# Patient Record
Sex: Female | Born: 1980 | Race: Black or African American | Hispanic: No | Marital: Single | Smoking: Current every day smoker
Health system: Southern US, Community
[De-identification: ages and names within clinical notes are randomized; demographics above are authoritative.]

## PROBLEM LIST (undated history)

## (undated) DIAGNOSIS — F329 Major depressive disorder, single episode, unspecified: Secondary | ICD-10-CM

## (undated) DIAGNOSIS — R519 Headache, unspecified: Secondary | ICD-10-CM

## (undated) DIAGNOSIS — D649 Anemia, unspecified: Secondary | ICD-10-CM

## (undated) DIAGNOSIS — F419 Anxiety disorder, unspecified: Secondary | ICD-10-CM

## (undated) DIAGNOSIS — F319 Bipolar disorder, unspecified: Secondary | ICD-10-CM

## (undated) DIAGNOSIS — O24419 Gestational diabetes mellitus in pregnancy, unspecified control: Secondary | ICD-10-CM

## (undated) DIAGNOSIS — O139 Gestational [pregnancy-induced] hypertension without significant proteinuria, unspecified trimester: Secondary | ICD-10-CM

## (undated) DIAGNOSIS — F32A Depression, unspecified: Secondary | ICD-10-CM

## (undated) HISTORY — DX: Anemia, unspecified: D64.9

## (undated) HISTORY — DX: Gestational (pregnancy-induced) hypertension without significant proteinuria, unspecified trimester: O13.9

## (undated) HISTORY — DX: Headache, unspecified: R51.9

## (undated) HISTORY — DX: Anxiety disorder, unspecified: F41.9

## (undated) HISTORY — DX: Depression, unspecified: F32.A

---
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
## 2001-05-20 ENCOUNTER — Emergency Department (HOSPITAL_COMMUNITY): Admission: EM | Admit: 2001-05-20 | Discharge: 2001-05-20 | Payer: Self-pay | Admitting: *Deleted

## 2008-12-22 ENCOUNTER — Emergency Department (HOSPITAL_COMMUNITY): Admission: EM | Admit: 2008-12-22 | Discharge: 2008-12-22 | Payer: Self-pay | Admitting: Emergency Medicine

## 2008-12-28 ENCOUNTER — Emergency Department (HOSPITAL_COMMUNITY): Admission: EM | Admit: 2008-12-28 | Discharge: 2008-12-28 | Payer: Self-pay | Admitting: Emergency Medicine

## 2009-02-03 ENCOUNTER — Inpatient Hospital Stay (HOSPITAL_COMMUNITY): Admission: AD | Admit: 2009-02-03 | Discharge: 2009-02-03 | Payer: Self-pay | Admitting: Obstetrics & Gynecology

## 2009-03-19 ENCOUNTER — Ambulatory Visit (HOSPITAL_COMMUNITY): Admission: RE | Admit: 2009-03-19 | Discharge: 2009-03-19 | Payer: Self-pay | Admitting: *Deleted

## 2009-04-18 ENCOUNTER — Ambulatory Visit (HOSPITAL_COMMUNITY): Admission: RE | Admit: 2009-04-18 | Discharge: 2009-04-18 | Payer: Self-pay | Admitting: Obstetrics & Gynecology

## 2009-09-07 ENCOUNTER — Ambulatory Visit: Payer: Self-pay | Admitting: Obstetrics & Gynecology

## 2009-09-07 ENCOUNTER — Inpatient Hospital Stay (HOSPITAL_COMMUNITY): Admission: AD | Admit: 2009-09-07 | Discharge: 2009-09-14 | Payer: Self-pay | Admitting: Family Medicine

## 2009-09-10 ENCOUNTER — Encounter: Payer: Self-pay | Admitting: Obstetrics & Gynecology

## 2009-09-21 ENCOUNTER — Observation Stay (HOSPITAL_COMMUNITY): Admission: AD | Admit: 2009-09-21 | Discharge: 2009-09-23 | Payer: Self-pay | Admitting: Obstetrics & Gynecology

## 2009-09-26 ENCOUNTER — Ambulatory Visit: Payer: Self-pay | Admitting: Obstetrics and Gynecology

## 2009-10-03 ENCOUNTER — Ambulatory Visit: Payer: Self-pay | Admitting: Obstetrics and Gynecology

## 2009-10-10 ENCOUNTER — Ambulatory Visit: Payer: Self-pay | Admitting: Obstetrics and Gynecology

## 2009-10-17 ENCOUNTER — Ambulatory Visit: Payer: Self-pay | Admitting: Obstetrics and Gynecology

## 2009-10-26 ENCOUNTER — Ambulatory Visit: Payer: Self-pay | Admitting: Obstetrics & Gynecology

## 2009-11-09 ENCOUNTER — Ambulatory Visit: Payer: Self-pay | Admitting: Obstetrics & Gynecology

## 2010-06-02 LAB — CBC
Hemoglobin: 6.5 g/dL — CL (ref 12.0–15.0)
MCH: 32.1 pg (ref 26.0–34.0)
MCV: 97 fL (ref 78.0–100.0)
RBC: 2.03 MIL/uL — ABNORMAL LOW (ref 3.87–5.11)
RDW: 13.6 % (ref 11.5–15.5)
WBC: 18.4 10*3/uL — ABNORMAL HIGH (ref 4.0–10.5)

## 2010-06-02 LAB — CULTURE, ROUTINE-ABSCESS

## 2010-06-02 LAB — URINALYSIS, ROUTINE W REFLEX MICROSCOPIC
Bilirubin Urine: NEGATIVE
Ketones, ur: NEGATIVE mg/dL
Protein, ur: NEGATIVE mg/dL
Specific Gravity, Urine: 1.01 (ref 1.005–1.030)
Urobilinogen, UA: 0.2 mg/dL (ref 0.0–1.0)

## 2010-06-02 LAB — COMPREHENSIVE METABOLIC PANEL
Albumin: 2.2 g/dL — ABNORMAL LOW (ref 3.5–5.2)
Alkaline Phosphatase: 202 U/L — ABNORMAL HIGH (ref 39–117)
CO2: 23 mEq/L (ref 19–32)
Chloride: 107 mEq/L (ref 96–112)
GFR calc Af Amer: >60 mL/min (ref >60)
Glucose, Bld: 96 mg/dL (ref 70–99)
Potassium: 4.2 mEq/L (ref 3.5–5.1)
Sodium: 137 mEq/L (ref 135–145)
Total Bilirubin: 0.4 mg/dL (ref 0.3–1.2)

## 2010-06-02 LAB — PROTEIN / CREATININE RATIO, URINE

## 2010-06-03 LAB — COMPREHENSIVE METABOLIC PANEL
ALT: 11 U/L (ref 0–35)
ALT: 13 U/L (ref 0–35)
AST: 24 U/L (ref 0–37)
AST: 32 U/L (ref 0–37)
Albumin: 1.7 g/dL — ABNORMAL LOW (ref 3.5–5.2)
BUN: 10 mg/dL (ref 6–23)
BUN: 5 mg/dL — ABNORMAL LOW (ref 6–23)
BUN: 8 mg/dL (ref 6–23)
CO2: 17 mEq/L — ABNORMAL LOW (ref 19–32)
CO2: 17 mEq/L — ABNORMAL LOW (ref 19–32)
CO2: 23 mEq/L (ref 19–32)
Calcium: 7.5 mg/dL — ABNORMAL LOW (ref 8.4–10.5)
Chloride: 105 mEq/L (ref 96–112)
Creatinine, Ser: 1.45 mg/dL — ABNORMAL HIGH (ref 0.4–1.2)
Creatinine, Ser: 1.55 mg/dL — ABNORMAL HIGH (ref 0.4–1.2)
Creatinine, Ser: 1.69 mg/dL — ABNORMAL HIGH (ref 0.4–1.2)
GFR calc Af Amer: 44 mL/min — ABNORMAL LOW (ref >60)
GFR calc non Af Amer: 36 mL/min — ABNORMAL LOW (ref >60)
GFR calc non Af Amer: 43 mL/min — ABNORMAL LOW (ref >60)
GFR calc non Af Amer: >60 mL/min (ref >60)
Glucose, Bld: 113 mg/dL — ABNORMAL HIGH (ref 70–99)
Glucose, Bld: 113 mg/dL — ABNORMAL HIGH (ref 70–99)
Glucose, Bld: 92 mg/dL (ref 70–99)
Potassium: 3.5 mEq/L (ref 3.5–5.1)
Sodium: 130 mEq/L — ABNORMAL LOW (ref 135–145)
Sodium: 136 mEq/L (ref 135–145)
Total Bilirubin: 0.5 mg/dL (ref 0.3–1.2)
Total Bilirubin: 0.5 mg/dL (ref 0.3–1.2)
Total Protein: 3.9 g/dL — ABNORMAL LOW (ref 6.0–8.3)
Total Protein: 5.7 g/dL — ABNORMAL LOW (ref 6.0–8.3)

## 2010-06-03 LAB — BASIC METABOLIC PANEL
CO2: 27 mEq/L (ref 19–32)
Calcium: 7 mg/dL — ABNORMAL LOW (ref 8.4–10.5)
Calcium: 7.7 mg/dL — ABNORMAL LOW (ref 8.4–10.5)
Creatinine, Ser: 1.25 mg/dL — ABNORMAL HIGH (ref 0.4–1.2)
GFR calc Af Amer: 50 mL/min — ABNORMAL LOW (ref >60)
GFR calc Af Amer: >60 mL/min (ref >60)
GFR calc non Af Amer: 41 mL/min — ABNORMAL LOW (ref >60)
Potassium: 3.7 mEq/L (ref 3.5–5.1)
Sodium: 136 mEq/L (ref 135–145)

## 2010-06-03 LAB — CBC
HCT: 19.9 % — ABNORMAL LOW (ref 36.0–46.0)
HCT: 24.3 % — ABNORMAL LOW (ref 36.0–46.0)
Hemoglobin: 10.6 g/dL — ABNORMAL LOW (ref 12.0–15.0)
Hemoglobin: 7 g/dL — ABNORMAL LOW (ref 12.0–15.0)
Hemoglobin: 7.4 g/dL — ABNORMAL LOW (ref 12.0–15.0)
Hemoglobin: 8.8 g/dL — ABNORMAL LOW (ref 12.0–15.0)
MCH: 33.4 pg (ref 26.0–34.0)
MCH: 33.5 pg (ref 26.0–34.0)
MCH: 33.5 pg (ref 26.0–34.0)
MCH: 33.7 pg (ref 26.0–34.0)
MCH: 34.7 pg — ABNORMAL HIGH (ref 26.0–34.0)
MCHC: 34.4 g/dL (ref 30.0–36.0)
MCHC: 34.5 g/dL (ref 30.0–36.0)
MCHC: 34.7 g/dL (ref 30.0–36.0)
MCV: 96.2 fL (ref 78.0–100.0)
MCV: 96.2 fL (ref 78.0–100.0)
MCV: 96.4 fL (ref 78.0–100.0)
MCV: 96.9 fL (ref 78.0–100.0)
MCV: 97.1 fL (ref 78.0–100.0)
MCV: 97.1 fL (ref 78.0–100.0)
Platelets: 100 10*3/uL — ABNORMAL LOW (ref 150–400)
Platelets: 101 10*3/uL — ABNORMAL LOW (ref 150–400)
Platelets: 102 10*3/uL — ABNORMAL LOW (ref 150–400)
Platelets: 71 10*3/uL — ABNORMAL LOW (ref 150–400)
Platelets: 80 10*3/uL — ABNORMAL LOW (ref 150–400)
Platelets: 82 10*3/uL — ABNORMAL LOW (ref 150–400)
Platelets: 87 10*3/uL — ABNORMAL LOW (ref 150–400)
RBC: 2.07 MIL/uL — ABNORMAL LOW (ref 3.87–5.11)
RBC: 2.14 MIL/uL — ABNORMAL LOW (ref 3.87–5.11)
RBC: 3.14 MIL/uL — ABNORMAL LOW (ref 3.87–5.11)
RBC: 3.26 MIL/uL — ABNORMAL LOW (ref 3.87–5.11)
RBC: 3.3 MIL/uL — ABNORMAL LOW (ref 3.87–5.11)
RDW: 13.1 % (ref 11.5–15.5)
RDW: 13.1 % (ref 11.5–15.5)
RDW: 13.1 % (ref 11.5–15.5)
RDW: 13.5 % (ref 11.5–15.5)
RDW: 13.6 % (ref 11.5–15.5)
RDW: 13.6 % (ref 11.5–15.5)
WBC: 16.5 10*3/uL — ABNORMAL HIGH (ref 4.0–10.5)
WBC: 16.5 10*3/uL — ABNORMAL HIGH (ref 4.0–10.5)
WBC: 17.8 10*3/uL — ABNORMAL HIGH (ref 4.0–10.5)
WBC: 8.4 10*3/uL (ref 4.0–10.5)
WBC: 9.1 10*3/uL (ref 4.0–10.5)
WBC: 9.7 10*3/uL (ref 4.0–10.5)

## 2010-06-03 LAB — MAGNESIUM
Magnesium: 12 mg/dL (ref 1.5–2.5)
Magnesium: 6.7 mg/dL (ref 1.5–2.5)

## 2010-06-03 LAB — CROSSMATCH

## 2010-06-03 LAB — URINALYSIS, DIPSTICK ONLY
Bilirubin Urine: NEGATIVE
Nitrite: NEGATIVE
Protein, ur: NEGATIVE mg/dL
Specific Gravity, Urine: <1.005 — ABNORMAL LOW (ref 1.005–1.030)

## 2010-06-03 LAB — MRSA PCR SCREENING: MRSA by PCR: NEGATIVE

## 2010-06-03 LAB — APTT: aPTT: 31 seconds (ref 24–37)

## 2010-06-03 LAB — FIBRINOGEN: Fibrinogen: 437 mg/dL (ref 204–475)

## 2010-06-03 LAB — PROTIME-INR
INR: 1.19 (ref 0.00–1.49)
INR: 1.23 (ref 0.00–1.49)
Prothrombin Time: 15 seconds (ref 11.6–15.2)
Prothrombin Time: 15.4 seconds — ABNORMAL HIGH (ref 11.6–15.2)

## 2010-06-03 LAB — RPR: RPR Ser Ql: NONREACTIVE

## 2010-12-10 ENCOUNTER — Emergency Department (HOSPITAL_COMMUNITY)
Admission: EM | Admit: 2010-12-10 | Discharge: 2010-12-11 | Disposition: A | Discharge status: Home / Self Care | Payer: Medicaid Other | Attending: Emergency Medicine | Admitting: Emergency Medicine

## 2010-12-10 DIAGNOSIS — R0989 Other specified symptoms and signs involving the circulatory and respiratory systems: Secondary | ICD-10-CM | POA: Insufficient documentation

## 2010-12-10 DIAGNOSIS — R5381 Other malaise: Secondary | ICD-10-CM | POA: Insufficient documentation

## 2010-12-10 DIAGNOSIS — J069 Acute upper respiratory infection, unspecified: Secondary | ICD-10-CM | POA: Insufficient documentation

## 2010-12-10 DIAGNOSIS — R05 Cough: Secondary | ICD-10-CM | POA: Insufficient documentation

## 2010-12-10 DIAGNOSIS — R0609 Other forms of dyspnea: Secondary | ICD-10-CM | POA: Insufficient documentation

## 2010-12-10 DIAGNOSIS — R071 Chest pain on breathing: Secondary | ICD-10-CM | POA: Insufficient documentation

## 2010-12-10 DIAGNOSIS — R059 Cough, unspecified: Secondary | ICD-10-CM | POA: Insufficient documentation

## 2010-12-11 ENCOUNTER — Emergency Department (HOSPITAL_COMMUNITY): Payer: Medicaid Other

## 2011-06-29 LAB — OB RESULTS CONSOLE RUBELLA ANTIBODY, IGM: Rubella: IMMUNE

## 2011-06-29 LAB — OB RESULTS CONSOLE GC/CHLAMYDIA: Chlamydia: NEGATIVE

## 2011-06-29 LAB — OB RESULTS CONSOLE HEPATITIS B SURFACE ANTIGEN: Hepatitis B Surface Ag: NEGATIVE

## 2011-06-29 LAB — OB RESULTS CONSOLE ABO/RH: RH Type: POSITIVE

## 2011-07-06 ENCOUNTER — Emergency Department (HOSPITAL_COMMUNITY)
Admission: EM | Admit: 2011-07-06 | Discharge: 2011-07-06 | Disposition: A | Discharge status: Home / Self Care | Payer: Medicaid Other | Attending: Emergency Medicine | Admitting: Emergency Medicine

## 2011-07-06 ENCOUNTER — Encounter (HOSPITAL_COMMUNITY): Payer: Self-pay | Admitting: Emergency Medicine

## 2011-07-06 DIAGNOSIS — F319 Bipolar disorder, unspecified: Secondary | ICD-10-CM | POA: Insufficient documentation

## 2011-07-06 DIAGNOSIS — F17200 Nicotine dependence, unspecified, uncomplicated: Secondary | ICD-10-CM | POA: Insufficient documentation

## 2011-07-06 DIAGNOSIS — K089 Disorder of teeth and supporting structures, unspecified: Secondary | ICD-10-CM | POA: Insufficient documentation

## 2011-07-06 DIAGNOSIS — K0889 Other specified disorders of teeth and supporting structures: Secondary | ICD-10-CM

## 2011-07-06 HISTORY — DX: Bipolar disorder, unspecified: F31.9

## 2011-07-06 MED ORDER — AMOXICILLIN 500 MG PO CAPS: 1000.0000 mg | ORAL_CAPSULE | Freq: Two times a day (BID) | ORAL | Status: AC

## 2011-07-06 MED ORDER — AMOXICILLIN 500 MG PO CAPS
1000.0000 mg | ORAL_CAPSULE | Freq: Once | ORAL | Status: AC
  Administered 2011-07-06: 1000 mg via ORAL
  Filled 2011-07-06: qty 2

## 2011-07-06 MED ORDER — OXYCODONE-ACETAMINOPHEN 5-325 MG PO TABS
1.0000 | ORAL_TABLET | Freq: Once | ORAL | Status: AC
  Administered 2011-07-06: 1 via ORAL
  Filled 2011-07-06: qty 1

## 2011-07-06 MED ORDER — OXYCODONE-ACETAMINOPHEN 5-325 MG PO TABS
1.0000 | ORAL_TABLET | ORAL | Status: AC | PRN
Start: 2011-07-06 — End: 2011-07-16

## 2011-07-06 NOTE — ED Provider Notes (Signed)
History     CSN: [id number]  Arrival date & time 07/06/11  0701   First MD Initiated Contact with Patient 07/06/11 [id number]      Chief Complaint  Patient presents with  . Dental Pain    (Consider location/radiation/quality/duration/timing/severity/associated sxs/prior treatment) Patient is a 31 y.o. female presenting with tooth pain. The history is provided by the patient.  Dental Pain She having pain in lower tooth on the right side for the last 2 weeks. This has been getting worse. She's been taking large doses of acetaminophen with no relief. Is worse with eating. Nothing makes it any better. She has had a prior extraction and that seems to be the focus for where her pain is. Of note, she is currently pregnant.  Past Medical History  Diagnosis Date  . Bipolar 1 disorder     Past Surgical History  Procedure Date  . Cesarean section     History reviewed. No pertinent family history.  History  Substance Use Topics  . Smoking status: Current Everyday Smoker -- 0.5 packs/day  . Smokeless tobacco: Not on file  . Alcohol Use: No    OB History    Grav Para Term Preterm Abortions TAB SAB Ect Mult Living   1               Review of Systems  All other systems reviewed and are negative.    Allergies  Review of patient's allergies indicates no known allergies.  Home Medications   Current Outpatient Rx  Name Route Sig Dispense Refill  . ACETAMINOPHEN 500 MG PO TABS Oral Take 1,000 mg by mouth every 6 (six) hours as needed. For pain    . ARIPIPRAZOLE 10 MG PO TABS Oral Take 10 mg by mouth daily.    Marland Kitchen PRENATAL MULTIVITAMIN CH Oral Take 1 tablet by mouth daily.    . TRAZODONE HCL 50 MG PO TABS Oral Take 50 mg by mouth at bedtime.    . AMOXICILLIN 500 MG PO CAPS Oral Take 2 capsules (1,000 mg total) by mouth 2 (two) times daily. 40 capsule 0  . OXYCODONE-ACETAMINOPHEN 5-325 MG PO TABS Oral Take 1 tablet by mouth every 4 (four) hours as needed for pain. 20 tablet 0    BP  109/92  Pulse 94  Temp(Src) 98.9 F (37.2 C) (Oral)  Resp 18  SpO2 100%  Physical Exam  Nursing note and vitals reviewed.  35-year-old female who appears uncomfortable. Vital signs are normal. Oxygen saturation is 100% which is normal. Head is normal cephalic and atraumatic. PERRLA, EOMI. There has been prior extraction of tooth #28 but there is no tenderness to palpation of the gingiva at that location. There is also some erythema and swelling of the gingiva around teeth #32 but this that area does not have any tenderness. Fillings are noted but no active dental caries are noted.  ED Course  Procedures (including critical care time)  Labs Reviewed - No data to display No results found.   1. Pain, dental       MDM  Lower gum pain lobe possibly related to retained fragment of tooth that had previously been extracted. She will be placed on amoxicillin and given Percocet for pain and is referred to the on-call dentist for followup care.        Dione Booze, MD 07/06/11 [phone]

## 2011-07-06 NOTE — Discharge Instructions (Signed)
Called the dentist tomorrow  Dental Pain A tooth ache may be caused by cavities (tooth decay). Cavities expose the nerve of the tooth to air and hot or cold temperatures. It may come from an infection or abscess (also called a boil or furuncle) around your tooth. It is also often caused by dental caries (tooth decay). This causes the pain you are having. DIAGNOSIS  Your caregiver can diagnose this problem by exam. TREATMENT   If caused by an infection, it may be treated with medications which kill germs (antibiotics) and pain medications as prescribed by your caregiver. Take medications as directed.   Only take over-the-counter or prescription medicines for pain, discomfort, or fever as directed by your caregiver.   Whether the tooth ache today is caused by infection or dental disease, you should see your dentist as soon as possible for further care.  SEEK MEDICAL CARE IF: The exam and treatment you received today has been provided on an emergency basis only. This is not a substitute for complete medical or dental care. If your problem worsens or new problems (symptoms) appear, and you are unable to meet with your dentist, call or return to this location. SEEK IMMEDIATE MEDICAL CARE IF:   You have a fever.   You develop redness and swelling of your face, jaw, or neck.   You are unable to open your mouth.   You have severe pain uncontrolled by pain medicine.  MAKE SURE YOU:   Understand these instructions.   Will watch your condition.   Will get help right away if you are not doing well or get worse.  Document Released: 03/03/2005 Document Revised: 02/20/2011 Document Reviewed: 10/20/2007 [facility] Patient Information 2012 [place], [place].  Acetaminophen; Oxycodone tablets What is this medicine? ACETAMINOPHEN; OXYCODONE (a set a MEE noe fen; ox i KOE done) is a pain reliever. It is used to treat mild to moderate pain. This medicine may be used for other purposes; ask your health  care provider or pharmacist if you have questions. What should I tell my health care provider before I take this medicine? They need to know if you have any of these conditions: -brain tumor -Crohn's disease, inflammatory bowel disease, or ulcerative colitis -drink more than 3 alcohol containing drinks per day -drug abuse or addiction -head injury -heart or circulation problems -kidney disease or problems going to the bathroom -liver disease -lung disease, asthma, or breathing problems -an unusual or allergic reaction to acetaminophen, oxycodone, other opioid analgesics, other medicines, foods, dyes, or preservatives -pregnant or trying to get pregnant -breast-feeding How should I use this medicine? Take this medicine by mouth with a full glass of water. Follow the directions on the prescription label. Take your medicine at regular intervals. Do not take your medicine more often than directed. Talk to your pediatrician regarding the use of this medicine in children. Special care may be needed. Patients over 74 years old may have a stronger reaction and need a smaller dose. Overdosage: If you think you have taken too much of this medicine contact a poison control center or emergency room at once. NOTE: This medicine is only for you. Do not share this medicine with others. What if I miss a dose? If you miss a dose, take it as soon as you can. If it is almost time for your next dose, take only that dose. Do not take double or extra doses. What may interact with this medicine? -alcohol or medicines that contain alcohol -antihistamines -barbiturates like  amobarbital, butalbital, butabarbital, methohexital, pentobarbital, phenobarbital, thiopental, and secobarbital -benztropine -drugs for bladder problems like solifenacin, trospium, oxybutynin, tolterodine, hyoscyamine, and methscopolamine -drugs for breathing problems like ipratropium and tiotropium -drugs for certain stomach or intestine  problems like propantheline, homatropine methylbromide, glycopyrrolate, atropine, belladonna, and dicyclomine -general anesthetics like etomidate, ketamine, nitrous oxide, propofol, desflurane, enflurane, halothane, isoflurane, and sevoflurane -medicines for depression, anxiety, or psychotic disturbances -medicines for pain like codeine, morphine, pentazocine, buprenorphine, butorphanol, nalbuphine, tramadol, and propoxyphene -medicines for sleep -muscle relaxants -naltrexone -phenothiazines like perphenazine, thioridazine, chlorpromazine, mesoridazine, fluphenazine, prochlorperazine, promazine, and trifluoperazine -scopolamine -trihexyphenidyl This list may not describe all possible interactions. Give your health care provider a list of all the medicines, herbs, non-prescription drugs, or dietary supplements you use. Also tell them if you smoke, drink alcohol, or use illegal drugs. Some items may interact with your medicine. What should I watch for while using this medicine? Tell your doctor or health care professional if your pain does not go away, if it gets worse, or if you have new or a different type of pain. You may develop tolerance to the medicine. Tolerance means that you will need a higher dose of the medication for pain relief. Tolerance is normal and is expected if you take this medicine for a long time. Do not suddenly stop taking your medicine because you may develop a severe reaction. Your body becomes used to the medicine. This does NOT mean you are addicted. Addiction is a behavior related to getting and using a drug for a nonmedical reason. If you have pain, you have a medical reason to take pain medicine. Your doctor will tell you how much medicine to take. If your doctor wants you to stop the medicine, the dose will be slowly lowered over time to avoid any side effects. You may get drowsy or dizzy. Do not drive, use machinery, or do anything that needs mental alertness until you  know how this medicine affects you. Do not stand or sit up quickly, especially if you are an older patient. This reduces the risk of dizzy or fainting spells. Alcohol may interfere with the effect of this medicine. Avoid alcoholic drinks. The medicine will cause constipation. Try to have a bowel movement at least every 2 to 3 days. If you do not have a bowel movement for 3 days, call your doctor or health care professional. Do not take Tylenol (acetaminophen) or medicines that have acetaminophen with this medicine. Too much acetaminophen can be very dangerous. Many nonprescription medicines contain acetaminophen. Always read the labels carefully to avoid taking more acetaminophen. What side effects may I notice from receiving this medicine? Side effects that you should report to your doctor or health care professional as soon as possible: -allergic reactions like skin rash, itching or hives, swelling of the face, lips, or tongue -breathing difficulties, wheezing -confusion -light headedness or fainting spells -severe stomach pain -yellowing of the skin or the whites of the eyes Side effects that usually do not require medical attention (report to your doctor or health care professional if they continue or are bothersome): -dizziness -drowsiness -nausea -vomiting This list may not describe all possible side effects. Call your doctor for medical advice about side effects. You may report side effects to FDA at 1-800-FDA-1088. Where should I keep my medicine? Keep out of the reach of children. This medicine can be abused. Keep your medicine in a safe place to protect it from theft. Do not share this medicine with anyone. Selling or giving  away this medicine is dangerous and against the law. Store at room temperature between 20 and 25 degrees C (68 and 77 degrees F). Keep container tightly closed. Protect from light. Flush any unused medicines down the toilet. Do not use the medicine after the  expiration date. NOTE: This sheet is a summary. It may not cover all possible information. If you have questions about this medicine, talk to your doctor, pharmacist, or health care provider.  2012, Elsevier/Gold Standard. (01/31/2008 10:01:21 AM)  Amoxicillin capsules or tablets What is this medicine? AMOXICILLIN (a mox i SIL in) is a penicillin antibiotic. It is used to treat certain kinds of bacterial infections. It will not work for colds, flu, or other viral infections. This medicine may be used for other purposes; ask your health care provider or pharmacist if you have questions. What should I tell my health care provider before I take this medicine? They need to know if you have any of these conditions: -asthma -kidney disease -an unusual or allergic reaction to amoxicillin, other penicillins, cephalosporin antibiotics, other medicines, foods, dyes, or preservatives -pregnant or trying to get pregnant -breast-feeding How should I use this medicine? Take this medicine by mouth with a glass of water. Follow the directions on your prescription label. You may take this medicine with food or on an empty stomach. Take your medicine at regular intervals. Do not take your medicine more often than directed. Take all of your medicine as directed even if you think your are better. Do not skip doses or stop your medicine early. Talk to your pediatrician regarding the use of this medicine in children. While this drug may be prescribed for selected conditions, precautions do apply. Overdosage: If you think you have taken too much of this medicine contact a poison control center or emergency room at once. NOTE: This medicine is only for you. Do not share this medicine with others. What if I miss a dose? If you miss a dose, take it as soon as you can. If it is almost time for your next dose, take only that dose. Do not take double or extra doses. What may interact with this  medicine? -amiloride -birth control pills -chloramphenicol -macrolides -probenecid -sulfonamides -tetracyclines This list may not describe all possible interactions. Give your health care provider a list of all the medicines, herbs, non-prescription drugs, or dietary supplements you use. Also tell them if you smoke, drink alcohol, or use illegal drugs. Some items may interact with your medicine. What should I watch for while using this medicine? Tell your doctor or health care professional if your symptoms do not improve in 2 or 3 days. Take all of the doses of your medicine as directed. Do not skip doses or stop your medicine early. If you are diabetic, you may get a false positive result for sugar in your urine with certain brands of urine tests. Check with your doctor. Do not treat diarrhea with over-the-counter products. Contact your doctor if you have diarrhea that lasts more than 2 days or if the diarrhea is severe and watery. What side effects may I notice from receiving this medicine? Side effects that you should report to your doctor or health care professional as soon as possible: -allergic reactions like skin rash, itching or hives, swelling of the face, lips, or tongue -breathing problems -dark urine -redness, blistering, peeling or loosening of the skin, including inside the mouth -seizures -severe or watery diarrhea -trouble passing urine or change in the amount of urine -  unusual bleeding or bruising -unusually weak or tired -yellowing of the eyes or skin Side effects that usually do not require medical attention (report to your doctor or health care professional if they continue or are bothersome): -dizziness -headache -stomach upset -trouble sleeping This list may not describe all possible side effects. Call your doctor for medical advice about side effects. You may report side effects to FDA at 1-800-FDA-1088. Where should I keep my medicine? Keep out of the reach of  children. Store between 68 and 77 degrees F (20 and 25 degrees C). Keep bottle closed tightly. Throw away any unused medicine after the expiration date. NOTE: This sheet is a summary. It may not cover all possible information. If you have questions about this medicine, talk to your doctor, pharmacist, or health care provider.  2012, Elsevier/Gold Standard. (05/25/2007 2:10:59 PM)

## 2011-07-06 NOTE — ED Notes (Signed)
Patient complaining of toothache (left lower portion of mouth) for the past two weeks, with pain worsening last night -- patient reports missing/cracked tooth.  Patient states that she has been taking extra strength Tylenol, and that it has provided little to no relief.

## 2011-10-23 ENCOUNTER — Inpatient Hospital Stay (HOSPITAL_COMMUNITY)
Admission: AD | Admit: 2011-10-23 | Discharge: 2011-10-25 | DRG: 774 | Disposition: A | Discharge status: Home / Self Care | Payer: Medicaid Other | Source: Ambulatory Visit | Attending: Obstetrics and Gynecology | Admitting: Obstetrics and Gynecology

## 2011-10-23 ENCOUNTER — Encounter (HOSPITAL_COMMUNITY): Payer: Self-pay | Admitting: *Deleted

## 2011-10-23 ENCOUNTER — Inpatient Hospital Stay (HOSPITAL_COMMUNITY)
Admission: AD | Admit: 2011-10-23 | Discharge: 2011-10-23 | Disposition: A | Discharge status: Home / Self Care | Payer: Medicaid Other | Source: Ambulatory Visit | Attending: Obstetrics and Gynecology | Admitting: Obstetrics and Gynecology

## 2011-10-23 DIAGNOSIS — Z22330 Carrier of Group B streptococcus: Secondary | ICD-10-CM

## 2011-10-23 DIAGNOSIS — O479 False labor, unspecified: Secondary | ICD-10-CM | POA: Insufficient documentation

## 2011-10-23 DIAGNOSIS — D696 Thrombocytopenia, unspecified: Secondary | ICD-10-CM | POA: Diagnosis not present

## 2011-10-23 DIAGNOSIS — O34219 Maternal care for unspecified type scar from previous cesarean delivery: Principal | ICD-10-CM | POA: Diagnosis present

## 2011-10-23 DIAGNOSIS — D689 Coagulation defect, unspecified: Secondary | ICD-10-CM | POA: Diagnosis not present

## 2011-10-23 DIAGNOSIS — O99892 Other specified diseases and conditions complicating childbirth: Secondary | ICD-10-CM | POA: Diagnosis present

## 2011-10-23 LAB — CBC
HCT: 38.8 % (ref 36.0–46.0)
MCH: 31 pg (ref 26.0–34.0)
MCHC: 33.8 g/dL (ref 30.0–36.0)
MCV: 91.7 fL (ref 78.0–100.0)
Platelets: 99 10*3/uL — ABNORMAL LOW (ref 150–400)
RDW: 13.6 % (ref 11.5–15.5)
WBC: 12.5 10*3/uL — ABNORMAL HIGH (ref 4.0–10.5)

## 2011-10-23 LAB — TYPE AND SCREEN

## 2011-10-23 MED ORDER — LACTATED RINGERS IV SOLN: INTRAVENOUS | Status: DC

## 2011-10-23 MED ORDER — CITRIC ACID-SODIUM CITRATE 334-500 MG/5ML PO SOLN: 30.0000 mL | ORAL | Status: DC | PRN

## 2011-10-23 MED ORDER — IBUPROFEN 600 MG PO TABS
600.0000 mg | ORAL_TABLET | Freq: Four times a day (QID) | ORAL | Status: DC
  Administered 2011-10-23 – 2011-10-24 (×4): 600 mg via ORAL
  Filled 2011-10-23 (×5): qty 1

## 2011-10-23 MED ORDER — SODIUM CHLORIDE 0.9 % IV SOLN
1.0000 g | Freq: Four times a day (QID) | INTRAVENOUS | Status: DC
  Filled 2011-10-23 (×2): qty 1000

## 2011-10-23 MED ORDER — TETANUS-DIPHTH-ACELL PERTUSSIS 5-2.5-18.5 LF-MCG/0.5 IM SUSP
0.5000 mL | Freq: Once | INTRAMUSCULAR | Status: DC
  Filled 2011-10-23: qty 0.5

## 2011-10-23 MED ORDER — IBUPROFEN 600 MG PO TABS
600.0000 mg | ORAL_TABLET | Freq: Four times a day (QID) | ORAL | Status: DC | PRN
  Administered 2011-10-23: 600 mg via ORAL
  Filled 2011-10-23: qty 1

## 2011-10-23 MED ORDER — OXYTOCIN BOLUS FROM INFUSION
250.0000 mL | Freq: Once | INTRAVENOUS | Status: DC
  Filled 2011-10-23: qty 500

## 2011-10-23 MED ORDER — SIMETHICONE 80 MG PO CHEW: 80.0000 mg | CHEWABLE_TABLET | ORAL | Status: DC | PRN

## 2011-10-23 MED ORDER — OXYCODONE-ACETAMINOPHEN 5-325 MG PO TABS: 1.0000 | ORAL_TABLET | ORAL | Status: DC | PRN

## 2011-10-23 MED ORDER — MEASLES, MUMPS & RUBELLA VAC ~~LOC~~ INJ: 0.5000 mL | INJECTION | Freq: Once | SUBCUTANEOUS | Status: DC

## 2011-10-23 MED ORDER — OXYCODONE-ACETAMINOPHEN 5-325 MG PO TABS
1.0000 | ORAL_TABLET | ORAL | Status: DC | PRN
  Administered 2011-10-25 (×2): 1 via ORAL
  Filled 2011-10-23 (×2): qty 1

## 2011-10-23 MED ORDER — BENZOCAINE-MENTHOL 20-0.5 % EX AERO
1.0000 "application " | INHALATION_SPRAY | CUTANEOUS | Status: DC | PRN
  Administered 2011-10-23: 1 via TOPICAL
  Filled 2011-10-23: qty 56

## 2011-10-23 MED ORDER — HYDROCORTISONE 2.5 % RE CREA
1.0000 "application " | TOPICAL_CREAM | Freq: Two times a day (BID) | RECTAL | Status: DC | PRN
  Administered 2011-10-23: 1 via RECTAL
  Filled 2011-10-23: qty 28.35

## 2011-10-23 MED ORDER — FLEET ENEMA 7-19 GM/118ML RE ENEM: 1.0000 | ENEMA | RECTAL | Status: DC | PRN

## 2011-10-23 MED ORDER — ONDANSETRON HCL 4 MG/2ML IJ SOLN: 4.0000 mg | Freq: Four times a day (QID) | INTRAMUSCULAR | Status: DC | PRN

## 2011-10-23 MED ORDER — LANOLIN HYDROUS EX OINT: TOPICAL_OINTMENT | CUTANEOUS | Status: DC | PRN

## 2011-10-23 MED ORDER — PRENATAL MULTIVITAMIN CH
1.0000 | ORAL_TABLET | Freq: Every day | ORAL | Status: DC
  Administered 2011-10-24 – 2011-10-25 (×2): 1 via ORAL
  Filled 2011-10-23 (×2): qty 1

## 2011-10-23 MED ORDER — OXYTOCIN 40 UNITS IN LACTATED RINGERS INFUSION - SIMPLE MED
62.5000 mL/h | Freq: Once | INTRAVENOUS | Status: AC
  Administered 2011-10-23: 62.5 mL/h via INTRAVENOUS

## 2011-10-23 MED ORDER — TRAZODONE HCL 50 MG PO TABS
50.0000 mg | ORAL_TABLET | Freq: Every day | ORAL | Status: DC
  Administered 2011-10-23 – 2011-10-24 (×2): 50 mg via ORAL
  Filled 2011-10-23 (×3): qty 1

## 2011-10-23 MED ORDER — SENNOSIDES-DOCUSATE SODIUM 8.6-50 MG PO TABS
2.0000 | ORAL_TABLET | Freq: Every day | ORAL | Status: DC
  Administered 2011-10-23: 2 via ORAL

## 2011-10-23 MED ORDER — DIBUCAINE 1 % RE OINT
1.0000 "application " | TOPICAL_OINTMENT | RECTAL | Status: DC | PRN
  Administered 2011-10-23: 1 via RECTAL
  Filled 2011-10-23: qty 28

## 2011-10-23 MED ORDER — WITCH HAZEL-GLYCERIN EX PADS
1.0000 "application " | MEDICATED_PAD | CUTANEOUS | Status: DC | PRN
  Administered 2011-10-23: 1 via TOPICAL

## 2011-10-23 MED ORDER — SODIUM CHLORIDE 0.9 % IV SOLN
2.0000 g | Freq: Once | INTRAVENOUS | Status: DC
  Filled 2011-10-23 (×2): qty 2000

## 2011-10-23 MED ORDER — PRENATAL MULTIVITAMIN CH: 1.0000 | ORAL_TABLET | Freq: Every day | ORAL | Status: DC

## 2011-10-23 MED ORDER — DIPHENHYDRAMINE HCL 25 MG PO CAPS: 25.0000 mg | ORAL_CAPSULE | Freq: Four times a day (QID) | ORAL | Status: DC | PRN

## 2011-10-23 MED ORDER — LACTATED RINGERS IV SOLN
INTRAVENOUS | Status: DC
  Administered 2011-10-23: 125 mL/h via INTRAVENOUS

## 2011-10-23 MED ORDER — LACTATED RINGERS IV SOLN: 500.0000 mL | INTRAVENOUS | Status: DC | PRN

## 2011-10-23 MED ORDER — OXYTOCIN 40 UNITS IN LACTATED RINGERS INFUSION - SIMPLE MED
INTRAVENOUS | Status: AC
  Filled 2011-10-23: qty 1000

## 2011-10-23 MED ORDER — ONDANSETRON HCL 4 MG PO TABS: 4.0000 mg | ORAL_TABLET | ORAL | Status: DC | PRN

## 2011-10-23 MED ORDER — LIDOCAINE HCL (PF) 1 % IJ SOLN: 30.0000 mL | INTRAMUSCULAR | Status: DC | PRN

## 2011-10-23 MED ORDER — ZOLPIDEM TARTRATE 5 MG PO TABS: 5.0000 mg | ORAL_TABLET | Freq: Every evening | ORAL | Status: DC | PRN

## 2011-10-23 MED ORDER — LIDOCAINE HCL (PF) 1 % IJ SOLN
INTRAMUSCULAR | Status: AC
  Filled 2011-10-23: qty 30

## 2011-10-23 MED ORDER — OXYTOCIN 10 UNIT/ML IJ SOLN
INTRAMUSCULAR | Status: AC
  Filled 2011-10-23: qty 1

## 2011-10-23 MED ORDER — ARIPIPRAZOLE 10 MG PO TABS
10.0000 mg | ORAL_TABLET | Freq: Every day | ORAL | Status: DC
  Administered 2011-10-23 – 2011-10-24 (×2): 10 mg via ORAL
  Filled 2011-10-23 (×3): qty 1

## 2011-10-23 MED ORDER — ACETAMINOPHEN 325 MG PO TABS: 650.0000 mg | ORAL_TABLET | ORAL | Status: DC | PRN

## 2011-10-23 MED ORDER — ONDANSETRON HCL 4 MG/2ML IJ SOLN: 4.0000 mg | INTRAMUSCULAR | Status: DC | PRN

## 2011-10-23 NOTE — H&P (Signed)
NAMERENAY, CRAMMER               ACCOUNT NO.:  [id number]  MEDICAL RECORD NO.:  [id number]  LOCATION:  9172                          FACILITY:  [facility]  PHYSICIAN:  Malachi Pro. Ambrose Mantle, M.D. DATE OF BIRTH:  09-21-1980  DATE OF ADMISSION:  10/23/2011 DATE OF DISCHARGE:                             HISTORY & PHYSICAL   HISTORY OF PRESENT ILLNESS:  This is a 31 year old black female, para 1- 0-0-1, gravida 2, [facility] November 02, 2011 by an ultrasound done at 21 weeks and 2 days on June 24, 2011, admitted in advanced labor.  Requesting a vaginal birth after cesarean.  Blood group and type O positive. Negative antibody.  Pap smear normal.  Rubella immune.  RPR nonreactive. Urine culture positive for E coli treated with Macrobid hepatitis B surface antigen negative, HIV negative, GC and Chlamydia negative. Hemoglobin AA.  Cystic fibrosis screen negative.  For 97 mutations.  She was too advanced for quad screen or first trimester screen, 1 hour Glucola was 82, group B strep was positive.  The patient began her prenatal course at [redacted] weeks gestation.  She was advised to stopped smoking cigarettes and also she had a history of smoking marijuana and if she was smoking marijuana, she was advised to quit.  She did admit to some use she had a history of depression and took Abilify and trazodone both of which were categories C drugs, she was advised to consult with her medical doctor to see if there would be an alternative.  She did have a history of a C-section with a two-layer closure, after a failed vacuum.  Otherwise, her prenatal course was essentially unremarkable. Her last prenatal visit recorded is on October 21, 2011 at that time, her cervix was still closed, 50% effaced, vertex was at a -2.  Her past medical history.  The patient was evaluated in maternity admission unit on the day of admission for an extended period of time.  She was not in labor.  The cervix did not change.  She was sent home, she  called back to our office and said that she was having contractions.  We advised her to come to the office.  She stated she did not know if she would be able to come.  She went to the maternity admission unit.  Maternity admission unit call said she was 7 cm, and within a very few minutes, I was called from labor and delivery and said that the patient was fully dilated and to come immediately.  PAST MEDICAL HISTORY:  Reveals a history of depression.  SURGICAL HISTORY:  Cesarean section in September 10, 2009 for decreased heart rate and failed vacuum extraction.  ALLERGIES:  No known drug allergies.  No food allergies and no latex allergy.  MEDICATIONS:  Abilify, prenatal vitamins and trazodone.  SOCIAL HISTORY:  Reveals a history of marijuana and tobacco use, 10th grade education.  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  PHYSICAL EXAMINATION:  VITAL SIGNS:  On admission, temperature 98.2, pulse 104, respiration 18, blood pressure 83/54. HEART:  Normal size and sounds.  No murmurs. LUNGS:  Clear to auscultation. ABDOMEN:  Soft.  At her last prenatal visit,  her fundal height was 36 cm.  Fetal heart tones are normal.  The patient is fully dilated.  ADMITTING IMPRESSION: 1. Intrauterine pregnancy at 38+ weeks.  Second stage of labor,     positive group B strep, ampicillin was not received in time to give     it prior to delivery.  The patient is prepared for delivery. 2. Depression.     Malachi Pro. Ambrose Mantle, M.D.     TFH/MEDQ  D:  10/23/2011  T:  10/23/2011  Job:  [id number]

## 2011-10-23 NOTE — Progress Notes (Signed)
Patient ID: Amber [name], female   DOB: 05-04-1980, 31 y.o.   MRN: [id number] Delivery note:  This patient had been evaluated today in MAU and had been discharged with no cervical change. She returned and was felt to be 7 cm dilated. She was sent to L&D and was found to be fully dilated. She was somewhat uncontrolled and combative but was able to push the baby down and delivered a living female infant LOA over an intact perineum. Weight is pending and the Apgars are 8 and 9 at 1 and 5 minutes. The placenta was delivered intact and the uterus was normal and the scar was intact. There was a left labial laceration that was made hemostatic with pressure and not repaired. EBL 400 cc's

## 2011-10-23 NOTE — MAU Note (Signed)
Spoke with Dr Ambrose Mantle, orders received to recheck cervix in 1 to 2 hours and if no change in cervix, discharge home

## 2011-10-23 NOTE — MAU Note (Signed)
Patient states she is having contractions every 1 1/2 minutes, denies any bleeding or leaking and reports good fetal movement.

## 2011-10-23 NOTE — MAU Note (Signed)
Pt presents with c/o contractions starting yesterday but got worse last night at 10pm. Pt denies any bleeding or leakage of fluid. States baby is active.

## 2011-10-23 NOTE — MAU Note (Signed)
Pt presents for labor and SROM.

## 2011-10-24 LAB — CBC
HCT: 32.7 % — ABNORMAL LOW (ref 36.0–46.0)
Hemoglobin: 11.2 g/dL — ABNORMAL LOW (ref 12.0–15.0)
RDW: 13.5 % (ref 11.5–15.5)
WBC: 15.4 10*3/uL — ABNORMAL HIGH (ref 4.0–10.5)

## 2011-10-24 NOTE — Progress Notes (Signed)
Patient ID: Amber [name], female   DOB: 02/04/1981, 31 y.o.   MRN: [id number] #1 afebrile BP normal HGB stable No complaints Baby weighed 5 pounds 14 ounces

## 2011-10-24 NOTE — Progress Notes (Signed)
UR chart review completed.  

## 2011-10-24 NOTE — Clinical Social Work Maternal (Signed)
    Clinical Social Work Department PSYCHOSOCIAL ASSESSMENT - MATERNAL/CHILD 10/24/2011  Patient:  Amber [name], Amber [name]  Account Number:  [id number]  Admit Date:  10/23/2011  Marjo Bicker Name:   Wynne Dust    Clinical Social Worker:  Andy Gauss   Date/Time:  10/24/2011 03:01 PM  Date Referred:  10/24/2011   Referral source  CN     Referred reason  Behavioral Health Issues  Substance Abuse   Other referral source:    I:  FAMILY / HOME ENVIRONMENT Child's legal guardian:  PARENT  Guardian - Name Guardian - Age Guardian - Address  Yeimy Brabant [address]; [place], [place] [address]  Dorena Bodo 24 (same as above)   Other household support members/support persons Name Relationship DOB  Rayvon Brandvold SON 09/10/09   Other support:   Sherlie Ban, mother    II  PSYCHOSOCIAL DATA Information Source:  Patient Interview  Event organiser Employment:   Financial resources:  [employer] If Medicaid - County:  [place] Clinical biochemist  [employer]  [employer]   School / Grade:   Maternity Care Coordinator / Child Services Coordination / Early Interventions:   Yes  Cultural issues impacting care:    III  STRENGTHS  Strength comment:    IV  RISK FACTORS AND CURRENT PROBLEMS Current Problem:  YES   Risk Factor & Current Problem Patient Issue Family Issue Risk Factor / Current Problem Comment  Mental Illness Y N Hx of depression  Substance Abuse Y N Hx of MJ use    V  SOCIAL WORK ASSESSMENT Pt was diagnosed with depression by staff at the [facility], 1 year ago.  She was prescribed medication, of which she took until pregnancy confirmation.  She reports being able to cope well without the medication during pregnancy.  Presently, pt is working with [employer].  She receives counseling sessions "once every 2 months."  She plans to restart medication upon discharge. She denies any history of SI.  Sw encouraged pt to follow  through with her plan to restart medication and continue working with the agency, as she reports that it's helping. Pt admits to smoking MJ, daily prior to pregnancy confirmation at 2 months.  Once pregnancy was confirmed to stopped smoking and denies other illegal substance use.  Sw explained hospital drug testing policy and pt verbalized understanding.  She denies history with CPS.  Pt has supplies for the infant.  FOB at the bedside and supportive.   Sw will monitor drug screen results and make a report if needed.      VI SOCIAL WORK PLAN Social Work Plan  No Further Intervention Required / No Barriers to Discharge   Type of pt/family education:   If child protective services report - county:   If child protective services report - date:   Information/referral to community resources comment:   Other social work plan:

## 2011-10-25 LAB — CBC
MCHC: 33.5 g/dL (ref 30.0–36.0)
Platelets: 102 10*3/uL — ABNORMAL LOW (ref 150–400)
RDW: 13.6 % (ref 11.5–15.5)

## 2011-10-25 MED ORDER — IBUPROFEN 600 MG PO TABS: 600.0000 mg | ORAL_TABLET | Freq: Four times a day (QID) | ORAL | Status: AC

## 2011-10-25 MED ORDER — OXYCODONE-ACETAMINOPHEN 5-325 MG PO TABS: 1.0000 | ORAL_TABLET | ORAL | Status: AC | PRN

## 2011-10-25 NOTE — Discharge Summary (Addendum)
Obstetric Discharge Summary Reason for Admission: onset of labor Prenatal Procedures: none Intrapartum Procedures: VBAC Postpartum Procedures: stable thrombocytopenia Complications-Operative and Postpartum:left labial, hemostatic and not repaired  Hemoglobin  Date Value Range Status  10/24/2011 11.2* 12.0 - 15.0 g/dL Final     HCT  Date Value Range Status  10/24/2011 32.7* 36.0 - 46.0 % Final    Physical Exam:  General: alert and cooperative Lochia: appropriate Uterine Fundus: firm   Discharge Diagnoses: Term Pregnancy-delivered  Discharge Information: Date: 10/25/2011 Activity: pelvic rest Diet: routine Medications: Ibuprofen and Percocet Condition: improved Instructions: refer to practice specific booklet Discharge to: home   Newborn Data: Live born female  Birth Weight: 5 lb 14 oz (2665 g) APGAR: 9, 9  Home with mother.  Oliver Pila 10/25/2011, 10:13 AM

## 2011-10-25 NOTE — Progress Notes (Signed)
Post Partum Day 2 Subjective: no complaints, up ad lib and tolerating PO  Objective: Blood pressure 107/72, pulse 92, temperature 97.7 F (36.5 C), temperature source Oral, resp. rate 18, unknown if currently breastfeeding.  Physical Exam:  General: alert and cooperative Lochia: appropriate Uterine Fundus: firm    Basename 10/24/11 0440 10/23/11 1550  HGB 11.2* 13.1  HCT 32.7* 38.8    Assessment/Plan: Discharge home Motrin, percocet F/u 6 weeks  LOS: 2 days   Lyndon Chenoweth W 10/25/2011, 10:12 AM

## 2013-06-20 LAB — OB RESULTS CONSOLE GC/CHLAMYDIA
CHLAMYDIA, DNA PROBE: NEGATIVE
GC PROBE AMP, GENITAL: NEGATIVE

## 2013-06-20 LAB — OB RESULTS CONSOLE RUBELLA ANTIBODY, IGM: RUBELLA: IMMUNE

## 2013-06-20 LAB — OB RESULTS CONSOLE RPR: RPR: NONREACTIVE

## 2013-06-20 LAB — OB RESULTS CONSOLE ABO/RH: RH Type: POSITIVE

## 2013-06-20 LAB — OB RESULTS CONSOLE HEPATITIS B SURFACE ANTIGEN: HEP B S AG: NEGATIVE

## 2013-06-20 LAB — OB RESULTS CONSOLE ANTIBODY SCREEN: Antibody Screen: NEGATIVE

## 2013-06-20 LAB — OB RESULTS CONSOLE HIV ANTIBODY (ROUTINE TESTING): HIV: NONREACTIVE

## 2013-07-27 ENCOUNTER — Inpatient Hospital Stay (HOSPITAL_COMMUNITY): Payer: Medicaid Other | Admitting: Anesthesiology

## 2013-07-27 ENCOUNTER — Encounter (HOSPITAL_COMMUNITY): Payer: Medicaid Other | Admitting: Anesthesiology

## 2013-07-27 ENCOUNTER — Encounter (HOSPITAL_COMMUNITY): Payer: Self-pay | Admitting: *Deleted

## 2013-07-27 ENCOUNTER — Inpatient Hospital Stay (HOSPITAL_COMMUNITY)
Admission: AD | Admit: 2013-07-27 | Discharge: 2013-07-28 | DRG: 775 | Disposition: A | Discharge status: Home / Self Care | Payer: Medicaid Other | Source: Ambulatory Visit | Attending: Obstetrics and Gynecology | Admitting: Obstetrics and Gynecology

## 2013-07-27 DIAGNOSIS — O99892 Other specified diseases and conditions complicating childbirth: Secondary | ICD-10-CM | POA: Diagnosis present

## 2013-07-27 DIAGNOSIS — F319 Bipolar disorder, unspecified: Secondary | ICD-10-CM

## 2013-07-27 DIAGNOSIS — O4290 Premature rupture of membranes, unspecified as to length of time between rupture and onset of labor, unspecified weeks of gestation: Secondary | ICD-10-CM

## 2013-07-27 DIAGNOSIS — O99334 Smoking (tobacco) complicating childbirth: Secondary | ICD-10-CM | POA: Diagnosis present

## 2013-07-27 DIAGNOSIS — O0930 Supervision of pregnancy with insufficient antenatal care, unspecified trimester: Secondary | ICD-10-CM

## 2013-07-27 DIAGNOSIS — O9989 Other specified diseases and conditions complicating pregnancy, childbirth and the puerperium: Secondary | ICD-10-CM

## 2013-07-27 DIAGNOSIS — O34219 Maternal care for unspecified type scar from previous cesarean delivery: Principal | ICD-10-CM | POA: Diagnosis present

## 2013-07-27 DIAGNOSIS — Z22330 Carrier of Group B streptococcus: Secondary | ICD-10-CM

## 2013-07-27 DIAGNOSIS — O99344 Other mental disorders complicating childbirth: Secondary | ICD-10-CM | POA: Diagnosis present

## 2013-07-27 LAB — RPR

## 2013-07-27 LAB — TYPE AND SCREEN
ABO/RH(D): O POS
Antibody Screen: NEGATIVE

## 2013-07-27 LAB — CBC
HEMATOCRIT: 32.5 % — AB (ref 36.0–46.0)
Hemoglobin: 11.2 g/dL — ABNORMAL LOW (ref 12.0–15.0)
MCH: 32.5 pg (ref 26.0–34.0)
MCHC: 34.5 g/dL (ref 30.0–36.0)
MCV: 94.2 fL (ref 78.0–100.0)
Platelets: 118 10*3/uL — ABNORMAL LOW (ref 150–400)
RBC: 3.45 MIL/uL — ABNORMAL LOW (ref 3.87–5.11)
RDW: 13.6 % (ref 11.5–15.5)
WBC: 11.1 10*3/uL — ABNORMAL HIGH (ref 4.0–10.5)

## 2013-07-27 LAB — POCT FERN TEST: POCT Fern Test: POSITIVE

## 2013-07-27 MED ORDER — DIBUCAINE 1 % RE OINT: 1.0000 "application " | TOPICAL_OINTMENT | RECTAL | Status: DC | PRN

## 2013-07-27 MED ORDER — DIPHENHYDRAMINE HCL 25 MG PO CAPS: 25.0000 mg | ORAL_CAPSULE | Freq: Four times a day (QID) | ORAL | Status: DC | PRN

## 2013-07-27 MED ORDER — SODIUM BICARBONATE 8.4 % IV SOLN
INTRAVENOUS | Status: DC | PRN
  Administered 2013-07-27: 5 mL via EPIDURAL

## 2013-07-27 MED ORDER — ONDANSETRON HCL 4 MG/2ML IJ SOLN: 4.0000 mg | INTRAMUSCULAR | Status: DC | PRN

## 2013-07-27 MED ORDER — ONDANSETRON HCL 4 MG/2ML IJ SOLN: 4.0000 mg | Freq: Four times a day (QID) | INTRAMUSCULAR | Status: DC | PRN

## 2013-07-27 MED ORDER — OXYTOCIN 40 UNITS IN LACTATED RINGERS INFUSION - SIMPLE MED: 62.5000 mL/h | INTRAVENOUS | Status: DC

## 2013-07-27 MED ORDER — DIPHENHYDRAMINE HCL 50 MG/ML IJ SOLN: 12.5000 mg | INTRAMUSCULAR | Status: DC | PRN

## 2013-07-27 MED ORDER — LACTATED RINGERS IV SOLN
INTRAVENOUS | Status: DC
  Administered 2013-07-27 (×2): via INTRAVENOUS

## 2013-07-27 MED ORDER — ONDANSETRON HCL 4 MG PO TABS: 4.0000 mg | ORAL_TABLET | ORAL | Status: DC | PRN

## 2013-07-27 MED ORDER — PRENATAL MULTIVITAMIN CH
1.0000 | ORAL_TABLET | Freq: Every day | ORAL | Status: DC
  Administered 2013-07-27: 1 via ORAL

## 2013-07-27 MED ORDER — EPHEDRINE 5 MG/ML INJ
10.0000 mg | INTRAVENOUS | Status: DC | PRN
  Filled 2013-07-27: qty 2

## 2013-07-27 MED ORDER — SIMETHICONE 80 MG PO CHEW: 80.0000 mg | CHEWABLE_TABLET | ORAL | Status: DC | PRN

## 2013-07-27 MED ORDER — PENICILLIN G POTASSIUM 5000000 UNITS IJ SOLR
5.0000 10*6.[IU] | Freq: Once | INTRAMUSCULAR | Status: AC
  Administered 2013-07-27: 5 10*6.[IU] via INTRAVENOUS
  Filled 2013-07-27: qty 5

## 2013-07-27 MED ORDER — EPHEDRINE 5 MG/ML INJ
INTRAVENOUS | Status: AC
  Filled 2013-07-27: qty 4

## 2013-07-27 MED ORDER — PHENYLEPHRINE 40 MCG/ML (10ML) SYRINGE FOR IV PUSH (FOR BLOOD PRESSURE SUPPORT)
80.0000 ug | PREFILLED_SYRINGE | INTRAVENOUS | Status: DC | PRN
  Filled 2013-07-27: qty 2

## 2013-07-27 MED ORDER — OXYCODONE-ACETAMINOPHEN 5-325 MG PO TABS
1.0000 | ORAL_TABLET | ORAL | Status: DC | PRN
  Administered 2013-07-27 – 2013-07-28 (×2): 1 via ORAL
  Filled 2013-07-27 (×2): qty 1

## 2013-07-27 MED ORDER — LACTATED RINGERS IV SOLN: 500.0000 mL | INTRAVENOUS | Status: DC | PRN

## 2013-07-27 MED ORDER — CITRIC ACID-SODIUM CITRATE 334-500 MG/5ML PO SOLN: 30.0000 mL | ORAL | Status: DC | PRN

## 2013-07-27 MED ORDER — MEASLES, MUMPS & RUBELLA VAC ~~LOC~~ INJ
0.5000 mL | INJECTION | Freq: Once | SUBCUTANEOUS | Status: DC
  Filled 2013-07-27: qty 0.5

## 2013-07-27 MED ORDER — IBUPROFEN 600 MG PO TABS
600.0000 mg | ORAL_TABLET | Freq: Four times a day (QID) | ORAL | Status: DC
  Administered 2013-07-27: 600 mg via ORAL
  Filled 2013-07-27 (×2): qty 1

## 2013-07-27 MED ORDER — ACETAMINOPHEN 325 MG PO TABS: 650.0000 mg | ORAL_TABLET | ORAL | Status: DC | PRN

## 2013-07-27 MED ORDER — FLEET ENEMA 7-19 GM/118ML RE ENEM: 1.0000 | ENEMA | RECTAL | Status: DC | PRN

## 2013-07-27 MED ORDER — PENICILLIN G POTASSIUM 5000000 UNITS IJ SOLR
2.5000 10*6.[IU] | INTRAVENOUS | Status: DC
  Administered 2013-07-27: 2.5 10*6.[IU] via INTRAVENOUS
  Filled 2013-07-27 (×5): qty 2.5

## 2013-07-27 MED ORDER — OXYTOCIN 40 UNITS IN LACTATED RINGERS INFUSION - SIMPLE MED
62.5000 mL/h | INTRAVENOUS | Status: AC | PRN
Start: 2013-07-27 — End: 2013-07-28

## 2013-07-27 MED ORDER — FENTANYL 2.5 MCG/ML BUPIVACAINE 1/10 % EPIDURAL INFUSION (WH - ANES)
INTRAMUSCULAR | Status: AC
  Administered 2013-07-27: 14 mL/h via EPIDURAL
  Filled 2013-07-27: qty 125

## 2013-07-27 MED ORDER — PRENATAL MULTIVITAMIN CH
1.0000 | ORAL_TABLET | Freq: Every day | ORAL | Status: DC
  Filled 2013-07-27: qty 1

## 2013-07-27 MED ORDER — OXYTOCIN 40 UNITS IN LACTATED RINGERS INFUSION - SIMPLE MED
1.0000 m[IU]/min | INTRAVENOUS | Status: DC
  Administered 2013-07-27: 2 m[IU]/min via INTRAVENOUS
  Filled 2013-07-27: qty 1000

## 2013-07-27 MED ORDER — LANOLIN HYDROUS EX OINT: TOPICAL_OINTMENT | CUTANEOUS | Status: DC | PRN

## 2013-07-27 MED ORDER — FENTANYL 2.5 MCG/ML BUPIVACAINE 1/10 % EPIDURAL INFUSION (WH - ANES)
14.0000 mL/h | INTRAMUSCULAR | Status: DC | PRN
  Administered 2013-07-27: 14 mL/h via EPIDURAL

## 2013-07-27 MED ORDER — TERBUTALINE SULFATE 1 MG/ML IJ SOLN: 0.2500 mg | Freq: Once | INTRAMUSCULAR | Status: DC | PRN

## 2013-07-27 MED ORDER — OXYTOCIN BOLUS FROM INFUSION: 500.0000 mL | INTRAVENOUS | Status: DC

## 2013-07-27 MED ORDER — FENTANYL 2.5 MCG/ML BUPIVACAINE 1/10 % EPIDURAL INFUSION (WH - ANES): 14.0000 mL/h | INTRAMUSCULAR | Status: DC | PRN

## 2013-07-27 MED ORDER — PHENYLEPHRINE 40 MCG/ML (10ML) SYRINGE FOR IV PUSH (FOR BLOOD PRESSURE SUPPORT)
PREFILLED_SYRINGE | INTRAVENOUS | Status: AC
  Filled 2013-07-27: qty 10

## 2013-07-27 MED ORDER — IBUPROFEN 600 MG PO TABS: 600.0000 mg | ORAL_TABLET | Freq: Four times a day (QID) | ORAL | Status: DC | PRN

## 2013-07-27 MED ORDER — LACTATED RINGERS IV SOLN: 500.0000 mL | Freq: Once | INTRAVENOUS | Status: DC

## 2013-07-27 MED ORDER — LIDOCAINE HCL (PF) 1 % IJ SOLN
30.0000 mL | INTRAMUSCULAR | Status: DC | PRN
  Filled 2013-07-27: qty 30

## 2013-07-27 MED ORDER — SENNOSIDES-DOCUSATE SODIUM 8.6-50 MG PO TABS
2.0000 | ORAL_TABLET | ORAL | Status: DC
  Filled 2013-07-27: qty 2

## 2013-07-27 MED ORDER — TETANUS-DIPHTH-ACELL PERTUSSIS 5-2.5-18.5 LF-MCG/0.5 IM SUSP
0.5000 mL | Freq: Once | INTRAMUSCULAR | Status: AC
  Administered 2013-07-28: 0.5 mL via INTRAMUSCULAR
  Filled 2013-07-27: qty 0.5

## 2013-07-27 MED ORDER — WITCH HAZEL-GLYCERIN EX PADS: 1.0000 "application " | MEDICATED_PAD | CUTANEOUS | Status: DC | PRN

## 2013-07-27 MED ORDER — ZOLPIDEM TARTRATE 5 MG PO TABS: 5.0000 mg | ORAL_TABLET | Freq: Every evening | ORAL | Status: DC | PRN

## 2013-07-27 MED ORDER — BENZOCAINE-MENTHOL 20-0.5 % EX AERO
1.0000 "application " | INHALATION_SPRAY | CUTANEOUS | Status: DC | PRN
  Filled 2013-07-27: qty 56

## 2013-07-27 MED ORDER — OXYCODONE-ACETAMINOPHEN 5-325 MG PO TABS: 1.0000 | ORAL_TABLET | ORAL | Status: DC | PRN

## 2013-07-27 MED ORDER — LACTATED RINGERS IV SOLN: INTRAVENOUS | Status: DC

## 2013-07-27 MED ORDER — BUTORPHANOL TARTRATE 1 MG/ML IJ SOLN
1.0000 mg | INTRAMUSCULAR | Status: DC | PRN
  Administered 2013-07-27: 1 mg via INTRAVENOUS
  Filled 2013-07-27: qty 1

## 2013-07-27 NOTE — Lactation Note (Signed)
This note was copied from the chart of Girl Katheran Jamesrystal Deroo. Lactation Consultation Note  Patient Name: Girl Katheran JamesCrystal [place] AVWUJ'WToday's Date: 07/27/2013 Reason for consult: Other (Comment)   Maternal Data Formula Feeding for Exclusion: Yes (baby in NICU) Reason for exclusion: Mother's choice to formula feed on admision  Feeding Feeding Type: Formula Nipple Type: Slow - flow Length of feed: 10 min  LATCH Score/Interventions                      Lactation Tools Discussed/Used     Consult Status Consult Status: Complete    Alfred LevinsChristine Anne Rishi Vicario 07/27/2013, 4:21 PM

## 2013-07-27 NOTE — MAU Note (Signed)
Pt reports gush of fluid at 0115, no pain

## 2013-07-27 NOTE — Progress Notes (Signed)
Patient ID: Amber [name], female   DOB: 1980-06-20, 33 y.o.   MRN: [id number] Delivery note:  This pt was 2-3 cm at just before 2 PM and progressed rapidly to full dilatation at + 2 station. I was called at my office and came quickly to the room She was placed in position for delivery and even though she had PROM there was a forebag that was ruptured with clear fluid. With 2 contractions the pt was able to bring the baby down and rotate it from LOT to LOA. The 3 pound 15 ounce female infant delivered spontaneously LOA over an intact perineum. The infant had Apgars of 9 and 9 at 1 and 5 minutes. The placenta delivered spontaneously and was intact. The uterus was normal and there were no lacerations. EBL 250 cc's.

## 2013-07-27 NOTE — Progress Notes (Signed)
Patient ID: Amber [name], female   DOB: [birth year], 33 y.o.   MRN: [id number] Pt is comfortable with an epidural. The FHR is normal Contractions are q 2-3 minutes. The cervix is 2-3 cm 100% effaced and the vertex is at + 1 station.

## 2013-07-27 NOTE — H&P (Signed)
Amber MurdochCrystal R [name] is a 33 y.o. female G3P2002 at 8336 1/7 weeks (08/23/13 by 29 week US)  presenting for SROM that occurred about 130am.  She is not having any significant contractions.  Prenatal care started late at 29 weeks and is complicated by daily use of tobacco and marijuana.  She also has bipolar disorder controlled on her regimen of abilify and remeron.  She had a prior c-section for a failed IOL for PIH, followed by a successful VBAC.  She desires a trial of labor again and sterilization, but forms are not 3630 days old yet.  Maternal Medical History:  Reason for admission: Rupture of membranes.   Contractions: Onset was 3-5 hours ago.   Frequency: rare.   Perceived severity is mild.    Fetal activity: Perceived fetal activity is normal.    Prenatal complications: Substance abuse.   Prenatal Complications - Diabetes: none.    OB History   Grav Para Term Preterm Abortions TAB SAB Ect Mult Living   3 2 2  0 0 0 0 0 0 2    2011 LTCS with 2 layer closure of uterus/PIH 2013 5#14oz VBAC  Past Medical History  Diagnosis Date  . Bipolar 1 disorder    Past Surgical History  Procedure Laterality Date  . Cesarean section     Family History: family history is negative for Alcohol abuse, Arthritis, Asthma, Birth defects, Cancer, COPD, Depression, Diabetes, Drug abuse, Early death, Heart disease, Hearing loss, Hyperlipidemia, Hypertension, Kidney disease, Learning disabilities, Mental illness, Mental retardation, Miscarriages / Stillbirths, Stroke, Vision loss, and Varicose Veins. Social History:  reports that she has been smoking Cigarettes.  She has been smoking about 0.50 packs per day. She does not have any smokeless tobacco history on file. She reports that she does not drink alcohol or use illicit drugs.   Prenatal Transfer Tool  Maternal Diabetes: No Genetic Screening: too late to care Maternal Ultrasounds/Referrals: Normal Fetal Ultrasounds or other Referrals:  None Maternal  Substance Abuse:  Yes:  Type: Smoker, Marijuana Significant Maternal Medications:  Meds include: Other: Abilify and remeron Significant Maternal Lab Results:  None Other Comments:  None  ROS  Dilation: 1 Effacement (%): 50 Station: -2 Exam by:: F. Morri, RNC Blood pressure 113/61, pulse 86, temperature 98 F (36.7 C), temperature source Oral, resp. rate 18, height 5\' 1"  (1.549 m), weight 68.04 kg (150 lb), SpO2 100.00%, unknown if currently breastfeeding. Maternal Exam:  Uterine Assessment: Contraction strength is mild.  Contraction frequency is rare.   Abdomen: Patient reports no abdominal tenderness. Fetal presentation: vertex  Introitus: Normal vulva. Normal vagina.  Ferning test: positive.  Nitrazine test: positive. Amniotic fluid character: clear.     Physical Exam  Constitutional: She appears well-developed and well-nourished.  Cardiovascular: Normal rate and regular rhythm.   Respiratory: Effort normal.  GI: Soft.  Genitourinary: Vagina normal.  Psychiatric: She has a normal mood and affect.    Prenatal labs: ABO, Rh: O/Positive/-- (04/06 0000) Antibody: Negative (04/06 0000) Rubella: Immune (04/06 0000) RPR: Nonreactive (04/06 0000)  HBsAg: Negative (04/06 0000)  HIV: Non-reactive (04/06 0000)  GBS:   pending One hour GTT 140 Three hour GTT 84/147/65/61 Too late for genetic screens Hgb AA CF negative  Assessment/Plan: Pt is a TOLAC with a successful VBAC x 1 following her c-section.  SHe has signed our consent form in the office and understands the risks and benefits of VBAC.  Her GBS was collected 07/22/13 but result has not returned, so have placed  on PCN for her preterm status and a h/o +GBS until her status is known.  Beginning pitocin, will follow progress.  Oliver PilaKathy W Zoe Nordin 07/27/2013, 6:39 AM

## 2013-07-27 NOTE — Progress Notes (Signed)
Patient ID: Amber [name], female   DOB: 1980-10-02, 33 y.o.   MRN: [id number] Pitocin is at 10 mu/minute and the contractions are q 2-3 minutes and painful. Pt is uncertain about an epidural. The cervix is 2 cm 80-90 % effaced and the vertex is at - 1 station. The head is in front of the slightly posterior cervix

## 2013-07-27 NOTE — Progress Notes (Signed)
Referred by staff to see Ms Barry DienesOwens. When I visited at 1915 she was not in her room. Requested that when she returns to page me if she is still anxious/agitated. Because she was not seen the Spiritual Care Referral is still in effect.  Benjie Karvonenharles D. Akirah Storck, DMin Chaplain

## 2013-07-27 NOTE — Progress Notes (Signed)
Patient ID: Amber [name], female   DOB: 08-14-1980, 33 y.o.   MRN: [id number] Pitocin is at 6 mu/ minute and the contractions are q 2-4 minutes. Pt states they are painful but she states she probably does not want an epidural. The FHR showed some late decelerations when she was tilted to her right but the decelerations have corrected with tilt to her left. The pt has signed her consent for VBAC form and I have discussed it with her again

## 2013-07-27 NOTE — Anesthesia Preprocedure Evaluation (Signed)

## 2013-07-27 NOTE — Progress Notes (Signed)
Pt became agitated when informed she could not go out to smoke.  I was supportive, used therapeutic touch to help her realize she needed to let us do our 1 hr check, included bleeding/ vitals and that it was dangerous to leave the building.  She stated she might have to Leave AMA.   I had to call security b/c she became verbally aggressive with her family, they left and she calmed down.    I informed Dr. Ambrose MantleHenley that pt. Left the floor, we called security, b/c he wanted to check on her.  They located her and she came back calmer and cooperative. The Charge RN requested that Dr. Ambrose MantleHenley inform pt that she could not leave the Hospital again.  That we CAN NOT be responsible for her safety if she leaves the hospital.  Domingo PulseMoira T. Rubye Oaksailey RN

## 2013-07-27 NOTE — MAU Provider Note (Signed)
Chief Complaint:  Rupture of Membranes   First Provider Initiated Contact with Patient 07/27/13 [id number]     HPI: Amber [name] is a 33 y.o. G3P2002 at 10129w1d who presents to maternity admissions reporting leaking clear fluid since 1:15 AM. Denies contractions or vaginal bleeding. Good fetal movement.   Pregnancy Course: Prior C-section followed by successful VBAC.  Past Medical History: Past Medical History  Diagnosis Date  . Bipolar 1 disorder     Past obstetric history: OB History  Gravida Para Term Preterm AB SAB TAB Ectopic Multiple Living  3 2 2  0 0 0 0 0 0 2    # Outcome Date GA Lbr Len/2nd Weight Sex Delivery Anes PTL Lv  3 CUR           2 TRM 10/23/11 6663w4d 06:43 / 00:14 2.665 kg (5 lb 14 oz) F VBAC None  Y  1 TRM               Past Surgical History: Past Surgical History  Procedure Laterality Date  . Cesarean section       Family History: History reviewed. No pertinent family history.  Social History: History  Substance Use Topics  . Smoking status: Current Every Day Smoker -- 0.50 packs/day    Types: Cigarettes  . Smokeless tobacco: Not on file  . Alcohol Use: No    Allergies: No Known Allergies  Meds:  Prescriptions prior to admission  Medication Sig Dispense Refill  . Prenatal Vit-Fe Fumarate-FA (PRENATAL MULTIVITAMIN) TABS Take 1 tablet by mouth daily.      . phenylephrine-shark liver oil-mineral oil-petrolatum (PREPARATION H) 0.25-3-14-71.9 % rectal ointment Place 1 application rectally 2 (two) times daily as needed. Takes for hemorrhoid pain        ROS: Pertinent findings in history of present illness.  Physical Exam  Blood pressure 122/79, pulse 86, temperature 98.4 F (36.9 C), temperature source Oral, resp. rate 20, height 5' (1.524 m), weight 68.04 kg (150 lb), SpO2 100.00%, unknown if currently breastfeeding. GENERAL: Well-developed, well-nourished female in no acute distress.  HEENT: normocephalic HEART: normal rate RESP: normal  effort ABDOMEN: Soft, non-tender, gravid appropriate for gestational age EXTREMITIES: Nontender, no edema NEURO: alert and oriented SPECULUM EXAM: NEFG, pulling moderate amount of Clear fluid, no blood, cervix clean  Cervical exam deferred.  FHT:  Baseline 130 , minimal-moderate variability, accelerations present, no decelerations Contractions: None   Labs: Results for orders placed during the hospital encounter of 07/27/13 (from the past 24 hour(s))  POCT FERN TEST     Status: None   Collection Time    07/27/13  4:02 AM      Result Value Ref Range   POCT Fern Test Positive = ruptured amniotic membanes    CBC     Status: Abnormal   Collection Time    07/27/13  5:20 AM      Result Value Ref Range   WBC 11.1 (*) 4.0 - 10.5 K/uL   RBC 3.45 (*) 3.87 - 5.11 MIL/uL   Hemoglobin 11.2 (*) 12.0 - 15.0 g/dL   HCT 19.132.5 (*) 47.836.0 - 29.546.0 %   MCV 94.2  78.0 - 100.0 fL   MCH 32.5  26.0 - 34.0 pg   MCHC 34.5  30.0 - 36.0 g/dL   RDW 62.113.6  30.811.5 - 65.715.5 %   Platelets 118 (*) 150 - 400 K/uL  TYPE AND SCREEN     Status: None   Collection Time    07/27/13  5:20 AM      Result Value Ref Range   ABO/RH(D) O POS     Antibody Screen NEG     Sample Expiration 07/30/2013      Imaging:  No results found.  MAU Course:   Assessment: Premature rupture of membranes  Plan: Admit to labor and delivery. Dr. Senaida Oresichardson to assume care of patient.  [place] Kerri-Anne Haeberle, [place] 07/27/2013 3:33 AM

## 2013-07-27 NOTE — Anesthesia Procedure Notes (Signed)
Epidural Patient location during procedure: OB  Preanesthetic Checklist Completed: patient identified, site marked, surgical consent, pre-op evaluation, timeout performed, IV checked, risks and benefits discussed and monitors and equipment checked  Epidural Patient position: sitting Prep: site prepped and draped and DuraPrep Patient monitoring: continuous pulse ox and blood pressure Approach: midline Location: L2-L3 Injection technique: LOR air  Needle:  Needle type: Tuohy  Needle gauge: 17 G Needle length: 9 cm and 9 Needle insertion depth: 5 cm cm Catheter type: closed end flexible Catheter size: 19 Gauge Catheter at skin depth: 10 cm Test dose: negative  Assessment Events: blood not aspirated, injection not painful, no injection resistance, negative IV test and no paresthesia  Additional Notes Dosing of Epidural:  1st dose, through catheter ............................................Marland Kitchen. epi 1:200K + Xylocaine 40 mg  2nd dose, through catheter, after waiting 3 minutes...Marland Kitchen.Marland Kitchen.epi 1:200K + Xylocaine 60 mg    ( 2% Xylo charted as a single dose in Epic Meds for ease of charting; actual dosing was fractionated as above, for saftey's sake)  As each dose occurred, patient was free of IV sx; and patient exhibited no evidence of SA injection.  Patient is more comfortable after epidural dosed. Please see RN's note for documentation of vital signs,and FHR which are stable.  Patient reminded not to try to ambulate with numb legs, and that an RN must be present when she attempts to get up.

## 2013-07-27 NOTE — Progress Notes (Signed)
Patient ID: Amber [name], female   DOB: Apr 29, 1980, 33 y.o.   MRN: [id number] GBS + on 07-22-13 Will continue penicillin

## 2013-07-28 LAB — CBC
HEMATOCRIT: 34.4 % — AB (ref 36.0–46.0)
HEMOGLOBIN: 11.7 g/dL — AB (ref 12.0–15.0)
MCH: 32.1 pg (ref 26.0–34.0)
MCHC: 34 g/dL (ref 30.0–36.0)
MCV: 94.2 fL (ref 78.0–100.0)
Platelets: 128 10*3/uL — ABNORMAL LOW (ref 150–400)
RBC: 3.65 MIL/uL — ABNORMAL LOW (ref 3.87–5.11)
RDW: 13.4 % (ref 11.5–15.5)
WBC: 13.9 10*3/uL — ABNORMAL HIGH (ref 4.0–10.5)

## 2013-07-28 MED ORDER — OXYCODONE-ACETAMINOPHEN 5-325 MG PO TABS: 1.0000 | ORAL_TABLET | Freq: Four times a day (QID) | ORAL | Status: DC | PRN

## 2013-07-28 MED ORDER — IBUPROFEN 600 MG PO TABS: 600.0000 mg | ORAL_TABLET | Freq: Four times a day (QID) | ORAL | Status: DC | PRN

## 2013-07-28 NOTE — Discharge Instructions (Signed)
booklet °

## 2013-07-28 NOTE — Progress Notes (Signed)
Patient ID: Amber [name], female   DOB: 15-Jul-1980, 33 y.o.   MRN: [id number] #1 afebrile BP borderline but no headache. Pt wants d/c so will d/c and get social work consult

## 2013-07-28 NOTE — Clinical Social Work Maternal (Signed)
Clinical Social Work Department PSYCHOSOCIAL ASSESSMENT - MATERNAL/CHILD March 27, 2013  Patient:  ADALEIGH, WARF  Account Number:  [id number]  Admit Date:  03-13-2014  Ardine Eng Name:   Jerral Ralph    Clinical Social Worker:  [place], [place]   Date/Time:  March 18, 2013 12:40 PM  Date Referred:  2013/11/14   Referral source  Physician     Referred reason  [facility]  Substance Abuse  NICU   Other referral source:    I:  FAMILY / HOME ENVIRONMENT Child's legal guardian:  PARENT  Guardian - Name Guardian - Age Guardian - Address  Adiyah Lame [address], [place] [address]  Joetta Manners  not involved, MOB was unsure how to spell his name   Other household support members/support persons Name Relationship DOB  Fey Coghill DAUGHTER 8/13  Bess Saltzman [facility] 6/11  Sharman Crate OTHER 5 yo "god daughter"   Other support:   her mother and friend    II  PSYCHOSOCIAL DATA Information Source:  Patient Interview  Occupational hygienist Employment:   chart review, discussion with NICU RN and Engineer, site resources:  [employer] [place]:  [place] Other  Food Stamps  [place] / Grade:   Maternity Care Coordinator / Child Services Coordination / Early Interventions:   33yo attends early headstart  Cultural issues impacting care:   none noted    III  STRENGTHS Strengths  Supportive family/friends   Strength comment:  Pt had friend present and was very open with CSW.   IV  RISK FACTORS AND CURRENT PROBLEMS Current Problem:  YES   Risk Factor & Current Problem Patient Issue Family Issue Risk Factor / Current Problem Comment  Mental Illness Y N MOB has bipolar disorder  Substance Abuse Y Y Pt admits to daily marijuana use, children in the home    V  SOCIAL WORK ASSESSMENT CSW met with MOB, her friend T'shell, and 50 yo daughter Amanat Hackel to complete assessment. MOB had  been asking to leave this am and unit had to get pt to agree to stay to meet CSW. MOB reports she was unaware of her pregnancy and thus started her care late at 29 weeks. She reports she smoked marijuana daily as she had issues with her "weight and needed to try to eat more". CSW inquired if MD had made other recommendations to assist, pt was not sure. MOB did not seem phased about UDS being positive and felt like it was "no big deal". She was very surprised when CSW stated a CPS report would be made as a result. She admitted past involvement, but stated it was closed as "it was not something I did". CSW explained CPS protocol and MOB was not phased.  MOB reports she has bipolar disorder and attends [employer] for follow up and medication management. It was not very clear if pt was on or off her meds currently. She mentioned both during this assessment, was on meds prior to knowledge of pregnancy. This may need clarification in order for baby to get appropriate care. MOB stated she planned to make an appointment soon. CSW discussed NICU resources to assist, what to expect and informed her that NICU CSW would be in touch,  etc. MOB felt sure baby would be home next week and appeared very unphased about NICU admit. MOB reports to have no items in place currently for Emm[place].  She was eager to leave the hospital today, but was appreciative of CSW visit.      [place] Social Work Plan  Child Scientist, forensic Report  Patient/Family Education  Psychosocial Support/Ongoing Assessment of Needs   Type of pt/family education:   Resource available in NICU  NICU stay   If child protective services report - county:  [place] If child protective services report - date:  07/28/2013 Information/referral to community resources comment:   FSN might be needed to assist   Other social work plan:   NICU CSW to follow closely    Loren Racer, [place] Social Worker [facility] [place] Wednesday, Thursday and Friday Phone: [phone] Fax: [phone]  covering for Terri Piedra

## 2013-07-28 NOTE — Clinical Social Work Note (Signed)
Clinical Social Work Department DRUG EXPOSED NEWBORN INTERVENTION 07/28/2013  Patient:  Amber [name]  Account Number:  [id number]  Admit date:  07/27/2013  Clinical Social Worker:  Malary Aylesworth GRIER Kailyn Dubie, LCSWA  Date:  07/28/2013 11:00 AM  Newborn screened for possible prenatal exposure to narcotics or illegal drugs per policy criteria:  Late prenatal care and admits to daily marijuana use    Mom/Parents informed of testing:  YES Informed by:  Grier Rogerio Boutelle   on 07/28/2013 Parents response:   ok, understanding    Urine drug screen sent to lab on:  07/27/2013 Urine drug screen results:   positive for marijuana    Meconium drug screen sent to lab on:   Meconium drug screen results:   pending collection    Mother/parents informed of results on:  07/28/2013,  In person If informed by telephone, contact number:   Mother/parents comments:    For positive test results:  Maia Handa GRIER Doug Bucklin, CSW notified [place] Department of Social Services on 07/28/2013  Intake worker:  Mr Bernard Phone Number:  [phone] Comments:    Date & time results faxed to physician office:     *Fax printed copy of results with this form to the baby's physician's office.  Name of physician:   Fax number:   Grier Deadrick Stidd, LCSW Clinical Social Worker [facility] Wednesday, Thursday and Friday Phone: [phone] Fax: [phone]  Covering for Colleen Shaw, NICU LCSW 

## 2013-07-28 NOTE — Anesthesia Postprocedure Evaluation (Signed)
Anesthesia Post Note  Patient: Amber [name]  Procedure(s) Performed: * No procedures listed *  Anesthesia type: Epidural  Patient location: Mother/Baby  Post pain: Pain level controlled  Post assessment: Post-op Vital signs reviewed  Last Vitals:  Filed Vitals:   07/28/13 0607  BP: 120/91  Pulse: 77  Temp: 36.7 C  Resp: 18    Post vital signs: Reviewed  Level of consciousness: awake  Complications: No apparent anesthesia complications

## 2013-07-29 NOTE — Discharge Summary (Signed)
Amber James:  [name], Amber [name]               ACCOUNT NO.:  [id number]  MEDICAL RECORD NO.:  [id number]  LOCATION:  9317                          FACILITY:  [facility]  PHYSICIAN:  Malachi Prohomas F. Ambrose MantleHenley, M.D. DATE OF BIRTH:  1980/04/20  DATE OF ADMISSION:  07/27/2013 DATE OF DISCHARGE:  07/28/2013                              DISCHARGE SUMMARY   This is a 33 year old black female, para 2-0-0-2, gravida 3 at 36-1/7th weeks gestation, presented for spontaneous rupture of membranes that occurred at 1:30 a.m.  She did not have any significant contractions. Her past medical history was remarkable for use of marijuana, bipolar disorder that was controlled on Abilify and Remeron, although she has not been taking it, and a prior C-section for failed induction for PIH followed by successful VBAC.  She requested a trial of labor again. After admission to the hospital, she was placed on Pitocin.  Her group B strep culture was positive.  She was placed on penicillin.  By 8:41 a.m., her contractions were every 2-4 minutes on 6 milliunits a minute of Pitocin.  VBAC was discussed in detail, she wanted to proceed.  By 12:13 p.m., she was 2 cm, 80-90%, vertex at a -1.  The patient received an epidural.  At 1:42 p.m., her cervix was 2-3 cm, 100%, vertex at a +1. The patient progressed rapidly to full dilatation and delivered spontaneously, LOA, over an intact perineum, a living female infant, 3 pounds and 15 ounces and had Apgars of 9 and 9 at 1 and 5 minutes. Placenta delivered intact.  Uterus was normal.  No lacerations.  Blood loss about 250 mL.  Postpartum, the patient did well.  The baby was sent to the NICU because of its small size, is doing well, it is being evaluated for presence of any drugs.  The patient's initial hemoglobin 11.2, hematocrit 32.5, white count 11,100, platelet count 118,000.  RPR not nonreactive.  Followup hemoglobin 11.7, hematocrit 34.4, platelet count 128,000.  FINAL DIAGNOSES:  Intrauterine  pregnancy at 36 weeks and 1 day, spontaneous rupture of membranes, premature labor and delivery, vaginal birth after cesarean.  OPERATION:  Spontaneous delivery, LOA.  FINAL CONDITION:  Improved.  INSTRUCTIONS:  Include our regular discharge instruction booklet, after visit summary.  Prescriptions for Motrin 600 mg 30 tablets 1 every 6 hours as needed for pain and Percocet 5/325 30 tablets 1 every 6 hours as needed for pain.  The patient was advised to return to the office in 6 weeks for followup examination.  She elected not to have a tubal ligation while she was in the hospital partially because the baby was small and premature, and partially because her uterus being quite small, would have made an umbilical incision, not the proper way to tie her tubes.     Malachi Prohomas F. Ambrose MantleHenley, M.D.     TFH/MEDQ  D:  07/28/2013  T:  07/29/2013  Job:  [id number]

## 2014-01-16 ENCOUNTER — Encounter (HOSPITAL_COMMUNITY): Payer: Self-pay | Admitting: *Deleted

## 2014-10-12 ENCOUNTER — Emergency Department (HOSPITAL_COMMUNITY)
Admission: EM | Admit: 2014-10-12 | Discharge: 2014-10-12 | Disposition: A | Discharge status: Home / Self Care | Payer: Medicaid Other | Attending: Emergency Medicine | Admitting: Emergency Medicine

## 2014-10-12 ENCOUNTER — Encounter (HOSPITAL_COMMUNITY): Payer: Self-pay | Admitting: *Deleted

## 2014-10-12 DIAGNOSIS — K0889 Other specified disorders of teeth and supporting structures: Secondary | ICD-10-CM

## 2014-10-12 DIAGNOSIS — Z8659 Personal history of other mental and behavioral disorders: Secondary | ICD-10-CM | POA: Diagnosis not present

## 2014-10-12 DIAGNOSIS — K088 Other specified disorders of teeth and supporting structures: Secondary | ICD-10-CM | POA: Insufficient documentation

## 2014-10-12 DIAGNOSIS — Z720 Tobacco use: Secondary | ICD-10-CM | POA: Diagnosis not present

## 2014-10-12 MED ORDER — DICLOFENAC SODIUM 75 MG PO TBEC: 75.0000 mg | DELAYED_RELEASE_TABLET | Freq: Two times a day (BID) | ORAL | Status: DC

## 2014-10-12 MED ORDER — CLINDAMYCIN HCL 300 MG PO CAPS: 300.0000 mg | ORAL_CAPSULE | Freq: Four times a day (QID) | ORAL | Status: DC

## 2014-10-12 NOTE — Discharge Instructions (Signed)

## 2014-10-12 NOTE — ED Notes (Signed)
Pt reports upper left tooth pain for several weeks.

## 2014-10-12 NOTE — ED Provider Notes (Signed)
CSN: [id number]     Arrival date & time 10/12/14  1439 History  This chart was scribed for Langston Masker, PA-C working with Marily Memos, MD by Elveria Rising, ED Scribe. This patient was seen in room TR03C/TR03C and the patient's care was started at 3:37 PM.   Chief Complaint  Patient presents with  . Dental Pain   The history is provided by the patient. No language interpreter was used.   HPI Comments: Amber [name] is a 34 y.o. female who presents to the Emergency Department complaining of aching left upper dental pain for one week now. Patient reports pain with chewing and applied pressure. Patient denies known fever or chills. Patient does not have a dentist.   Past Medical History  Diagnosis Date  . Bipolar 1 disorder    Past Surgical History  Procedure Laterality Date  . Cesarean section     Family History  Problem Relation Age of Onset  . Alcohol abuse Neg Hx   . Arthritis Neg Hx   . Asthma Neg Hx   . Birth defects Neg Hx   . Cancer Neg Hx   . COPD Neg Hx   . Depression Neg Hx   . Diabetes Neg Hx   . Drug abuse Neg Hx   . Early death Neg Hx   . Heart disease Neg Hx   . Hearing loss Neg Hx   . Hyperlipidemia Neg Hx   . Hypertension Neg Hx   . Kidney disease Neg Hx   . Learning disabilities Neg Hx   . Mental illness Neg Hx   . Mental retardation Neg Hx   . Miscarriages / Stillbirths Neg Hx   . Stroke Neg Hx   . Vision loss Neg Hx   . Varicose Veins Neg Hx    History  Substance Use Topics  . Smoking status: Current Every Day Smoker -- 0.50 packs/day    Types: Cigarettes  . Smokeless tobacco: Not on file  . Alcohol Use: No   OB History    Gravida Para Term Preterm AB TAB SAB Ectopic Multiple Living   0 0 0 0 0 3     Review of Systems  Constitutional: Negative for fever.  HENT: Positive for dental problem. Negative for sore throat and trouble swallowing.   All other systems reviewed and are negative.   Allergies  Review of patient's allergies  indicates no known allergies.  Home Medications   Prior to Admission medications   Medication Sig Start Date End Date Taking? Authorizing Provider  ibuprofen (ADVIL,MOTRIN) 600 MG tablet Take 1 tablet (600 mg total) by mouth every 6 (six) hours as needed. 07/28/13   Tracey Harries, MD  oxyCODONE-acetaminophen (PERCOCET/ROXICET) 5-325 MG per tablet Take 1 tablet by mouth every 6 (six) hours as needed for moderate pain. 07/28/13   Tracey Harries, MD  phenylephrine-shark liver oil-mineral oil-petrolatum (PREPARATION H) 0.25-3-14-71.9 % rectal ointment Place 1 application rectally 2 (two) times daily as needed. Takes for hemorrhoid pain    Historical Provider, MD  Prenatal Vit-Fe Fumarate-FA (PRENATAL MULTIVITAMIN) TABS Take 1 tablet by mouth daily.    Historical Provider, MD   Triage Vitals: BP 114/80 mmHg  Pulse 90  Temp(Src) 98.1 F (36.7 C) (Oral)  Resp 16  SpO2 100%  LMP 10/10/2014 Physical Exam  Constitutional: She is oriented to person, place, and time. She appears well-developed and well-nourished. No distress.  HENT:  Head: Normocephalic and atraumatic.  Swelling upper left gumline  around first and second molar.   Eyes: EOM are normal.  Neck: Neck supple. No tracheal deviation present.  Cardiovascular: Normal rate.   Pulmonary/Chest: Effort normal. No respiratory distress.  Musculoskeletal: Normal range of motion.  Neurological: She is alert and oriented to person, place, and time.  Skin: Skin is warm and dry.  Psychiatric: She has a normal mood and affect. Her behavior is normal.  Nursing note and vitals reviewed.   ED Course  Procedures (including critical care time)  COORDINATION OF CARE: 3:40 PM- Discussed treatment plan with patient at bedside and patient agreed to plan.   Labs Review Labs Reviewed - No data to display  Imaging Review No results found.   EKG Interpretation None      MDM   Final diagnoses:  Toothache   Meds ordered this encounter   Medications  . clindamycin (CLEOCIN) 300 MG capsule    Sig: Take 1 capsule (300 mg total) by mouth 4 (four) times daily.    Dispense:  28 capsule    Refill:  0    Order Specific Question:  Supervising Provider    Answer:  MILLER, BRIAN [[id number]]  . diclofenac (VOLTAREN) 75 MG EC tablet    Sig: Take 1 tablet (75 mg total) by mouth 2 (two) times daily.    Dispense:  14 tablet    Refill:  0    Order Specific Question:  Supervising Provider    Answer:  Eber Hong [[id number]]   avs Resource guide  I personally performed the services in this documentation, which was scribed in my presence.  The recorded information has been reviewed and considered.   Barnet Pall.  Lonia Skinner [place], PA-C 10/12/14 1831  Marily Memos, MD 10/12/14 2006

## 2014-11-08 ENCOUNTER — Emergency Department (HOSPITAL_COMMUNITY)
Admission: EM | Admit: 2014-11-08 | Discharge: 2014-11-08 | Disposition: A | Discharge status: Home / Self Care | Payer: Medicaid Other | Attending: Emergency Medicine | Admitting: Emergency Medicine

## 2014-11-08 ENCOUNTER — Encounter (HOSPITAL_COMMUNITY): Payer: Self-pay | Admitting: *Deleted

## 2014-11-08 DIAGNOSIS — Z8659 Personal history of other mental and behavioral disorders: Secondary | ICD-10-CM | POA: Insufficient documentation

## 2014-11-08 DIAGNOSIS — Y9389 Activity, other specified: Secondary | ICD-10-CM | POA: Insufficient documentation

## 2014-11-08 DIAGNOSIS — Z23 Encounter for immunization: Secondary | ICD-10-CM | POA: Insufficient documentation

## 2014-11-08 DIAGNOSIS — Z79899 Other long term (current) drug therapy: Secondary | ICD-10-CM | POA: Diagnosis not present

## 2014-11-08 DIAGNOSIS — Z720 Tobacco use: Secondary | ICD-10-CM | POA: Diagnosis not present

## 2014-11-08 DIAGNOSIS — Z792 Long term (current) use of antibiotics: Secondary | ICD-10-CM | POA: Insufficient documentation

## 2014-11-08 DIAGNOSIS — Y998 Other external cause status: Secondary | ICD-10-CM | POA: Insufficient documentation

## 2014-11-08 DIAGNOSIS — Y9289 Other specified places as the place of occurrence of the external cause: Secondary | ICD-10-CM | POA: Insufficient documentation

## 2014-11-08 DIAGNOSIS — IMO0002 Reserved for concepts with insufficient information to code with codable children: Secondary | ICD-10-CM

## 2014-11-08 DIAGNOSIS — T63481A Toxic effect of venom of other arthropod, accidental (unintentional), initial encounter: Secondary | ICD-10-CM | POA: Insufficient documentation

## 2014-11-08 MED ORDER — CEPHALEXIN 250 MG PO CAPS
1000.0000 mg | ORAL_CAPSULE | Freq: Once | ORAL | Status: AC
  Administered 2014-11-08: 1000 mg via ORAL
  Filled 2014-11-08: qty 4

## 2014-11-08 MED ORDER — CEPHALEXIN 500 MG PO CAPS: 500.0000 mg | ORAL_CAPSULE | Freq: Four times a day (QID) | ORAL | Status: DC

## 2014-11-08 MED ORDER — TETANUS-DIPHTH-ACELL PERTUSSIS 5-2.5-18.5 LF-MCG/0.5 IM SUSP
0.5000 mL | Freq: Once | INTRAMUSCULAR | Status: AC
  Administered 2014-11-08: 0.5 mL via INTRAMUSCULAR
  Filled 2014-11-08: qty 0.5

## 2014-11-08 MED ORDER — IBUPROFEN 400 MG PO TABS
400.0000 mg | ORAL_TABLET | Freq: Once | ORAL | Status: AC
  Administered 2014-11-08: 400 mg via ORAL
  Filled 2014-11-08: qty 1

## 2014-11-08 NOTE — ED Notes (Signed)
NAD at this time. Pt is stable and going home.  

## 2014-11-08 NOTE — ED Provider Notes (Signed)
CSN: [id number]     Arrival date & time 11/08/14  4098 History   First MD Initiated Contact with Patient 11/08/14 [phone]     Chief Complaint  Patient presents with  . Insect Bite     (Consider location/radiation/quality/duration/timing/severity/associated sxs/prior Treatment) The history is provided by the patient.  Patient indicates 2 days ago, felt something fly up into shirt, and sting her on right lower lateral chest wall. Localized pain to area since. Constant. Dull. Burning. Moderate.  No hx allergic rxn to bites or stings. No generalized rash or symptoms. No fever or chills. In past day, has noticed warmth to area and mild spreading redness. No severe pain to area. No nv. Does not feel ill or sick.  States otherwise recent health at baseline.       Past Medical History  Diagnosis Date  . Bipolar 1 disorder    Past Surgical History  Procedure Laterality Date  . Cesarean section     Family History  Problem Relation Age of Onset  . Alcohol abuse Neg Hx   . Arthritis Neg Hx   . Asthma Neg Hx   . Birth defects Neg Hx   . Cancer Neg Hx   . COPD Neg Hx   . Depression Neg Hx   . Diabetes Neg Hx   . Drug abuse Neg Hx   . Early death Neg Hx   . Heart disease Neg Hx   . Hearing loss Neg Hx   . Hyperlipidemia Neg Hx   . Hypertension Neg Hx   . Kidney disease Neg Hx   . Learning disabilities Neg Hx   . Mental illness Neg Hx   . Mental retardation Neg Hx   . Miscarriages / Stillbirths Neg Hx   . Stroke Neg Hx   . Vision loss Neg Hx   . Varicose Veins Neg Hx    Social History  Substance Use Topics  . Smoking status: Current Every Day Smoker -- 0.50 packs/day    Types: Cigarettes  . Smokeless tobacco: None  . Alcohol Use: No   OB History    Gravida Para Term Preterm AB TAB SAB Ectopic Multiple Living   3 3 2 1  0 0 0 0 0 3     Review of Systems  Constitutional: Negative for fever and chills.  HENT: Negative for sore throat and trouble swallowing.   Respiratory:  Negative for shortness of breath.   Cardiovascular: Negative for leg swelling.  Gastrointestinal: Negative for nausea and vomiting.  Musculoskeletal: Negative for back pain.  Skin: Negative for rash.  Neurological: Negative for syncope and light-headedness.      Allergies  Review of patient's allergies indicates no known allergies.  Home Medications   Prior to Admission medications   Medication Sig Start Date End Date Taking? Authorizing Provider  clindamycin (CLEOCIN) 300 MG capsule Take 1 capsule (300 mg total) by mouth 4 (four) times daily. 10/12/14   Elson Areas, PA-C  diclofenac (VOLTAREN) 75 MG EC tablet Take 1 tablet (75 mg total) by mouth 2 (two) times daily. 10/12/14   Elson Areas, PA-C  ibuprofen (ADVIL,MOTRIN) 600 MG tablet Take 1 tablet (600 mg total) by mouth every 6 (six) hours as needed. 07/28/13   Tracey Harries, MD  oxyCODONE-acetaminophen (PERCOCET/ROXICET) 5-325 MG per tablet Take 1 tablet by mouth every 6 (six) hours as needed for moderate pain. 07/28/13   Tracey Harries, MD  phenylephrine-shark liver oil-mineral oil-petrolatum (PREPARATION H) 0.25-3-14-71.9 % rectal ointment Place  1 application rectally 2 (two) times daily as needed. Takes for hemorrhoid pain    Historical Provider, MD  Prenatal Vit-Fe Fumarate-FA (PRENATAL MULTIVITAMIN) TABS Take 1 tablet by mouth daily.    Historical Provider, MD   BP 132/90 mmHg  Pulse 88  Temp(Src) 98.4 F (36.9 C) (Oral)  Resp 16  Ht  (1.549 m)  Wt 140 lb (63.504 kg)  BMI 26.47 kg/m2  SpO2 100%  LMP 10/10/2014 Physical Exam  Constitutional: She appears well-developed and well-nourished. No distress.  HENT:  Mouth/Throat: Oropharynx is clear and moist.  Eyes: Conjunctivae are normal. No scleral icterus.  Neck: Neck supple. No tracheal deviation present.  Cardiovascular: Normal rate and intact distal pulses.   Pulmonary/Chest: Effort normal. No respiratory distress.  Abdominal: Normal appearance. She exhibits no  distension.  Musculoskeletal: She exhibits no edema.  Neurological: She is alert.  Skin: Skin is warm and dry. No rash noted. She is not diaphoretic.  Right inferior, lateral chest wall, pt points to area of sting. No stinger or fb. Area of surrounding warmth, and erythema, approximately 15 cm diameter, no fluctuance/abscess, appears c/w early/mild cellulitis. No crepitus. No necrotic or devitalized tissue. No severe pain to area.   Psychiatric: She has a normal mood and affect.  Nursing note and vitals reviewed.   ED Course  Procedures (including critical care time) Labs Review     MDM   Reviewed nursing notes and prior charts for additional history.   Exam c/w early, mild cellulitis. Confirmed nkda. Keflex and rx provided. Motrin prn.  Tetanus unknown, ? > 10 yrs, will update.     Cathren Laine, MD 11/08/14 [phone]

## 2014-11-08 NOTE — Discharge Instructions (Signed)
It was our pleasure to provide your ER care today - we hope that you feel better.  Keep area very clean, wash with soap and water 2x/day.  Take keflex (antibiotic) as prescribed.  Take motrin or aleve as need.  Return to ER if worse, severe pain, spreading redness, fever/chills, other concern.      Cellulitis Cellulitis is an infection of the skin and the tissue beneath it. The infected area is usually red and tender. Cellulitis occurs most often in the arms and lower legs.  CAUSES  Cellulitis is caused by bacteria that enter the skin through cracks or cuts in the skin. The most common types of bacteria that cause cellulitis are staphylococci and streptococci. SIGNS AND SYMPTOMS   Redness and warmth.  Swelling.  Tenderness or pain.  Fever. DIAGNOSIS  Your health care provider can usually determine what is wrong based on a physical exam. Blood tests may also be done. TREATMENT  Treatment usually involves taking an antibiotic medicine. HOME CARE INSTRUCTIONS   Take your antibiotic medicine as directed by your health care provider. Finish the antibiotic even if you start to feel better.  Keep the infected arm or leg elevated to reduce swelling.  Apply a warm cloth to the affected area up to 4 times per day to relieve pain.  Take medicines only as directed by your health care provider.  Keep all follow-up visits as directed by your health care provider. SEEK MEDICAL CARE IF:   You notice red streaks coming from the infected area.  Your red area gets larger or turns dark in color.  Your bone or joint underneath the infected area becomes painful after the skin has healed.  Your infection returns in the same area or another area.  You notice a swollen bump in the infected area.  You develop new symptoms.  You have a fever. SEEK IMMEDIATE MEDICAL CARE IF:   You feel very sleepy.  You develop vomiting or diarrhea.  You have a general ill feeling (malaise) with  muscle aches and pains. MAKE SURE YOU:   Understand these instructions.  Will watch your condition.  Will get help right away if you are not doing well or get worse. Document Released: 12/11/2004 Document Revised: 07/18/2013 Document Reviewed: 05/19/2011 [facility] Patient Information 2015 [place], [place]. This information is not intended to replace advice given to you by your health care provider. Make sure you discuss any questions you have with your health care provider.    Bee, Wasp, or Hornet Sting Your caregiver has diagnosed you as having an insect sting. An insect sting appears as a red lump in the skin that sometimes has a tiny hole in the center, or it may have a stinger in the center of the wound. The most common stings are from wasps, hornets and bees. Individuals have different reactions to insect stings.  A normal reaction may cause pain, swelling, and redness around the sting site.  A localized allergic reaction may cause swelling and redness that extends beyond the sting site.  A large local reaction may continue to develop over the next 12 to 36 hours.  On occasion, the reactions can be severe (anaphylactic reaction). An anaphylactic reaction may cause wheezing; difficulty breathing; chest pain; fainting; raised, itchy, red patches on the skin; a sick feeling to your stomach (nausea); vomiting; cramping; or diarrhea. If you have had an anaphylactic reaction to an insect sting in the past, you are more likely to have one again. HOME CARE  INSTRUCTIONS   With bee stings, a small sac of poison is left in the wound. Brushing across this with something such as a credit card, or anything similar, will help remove this and decrease the amount of the reaction. This same procedure will not help a wasp sting as they do not leave behind a stinger and poison sac.  Apply a cold compress for 10 to 20 minutes every hour for 1 to 2 days, depending on severity, to reduce swelling and  itching.  To lessen pain, a paste made of water and baking soda may be rubbed on the bite or sting and left on for 5 minutes.  To relieve itching and swelling, you may use take medication or apply medicated creams or lotions as directed.  Only take over-the-counter or prescription medicines for pain, discomfort, or fever as directed by your caregiver.  Wash the sting site daily with soap and water. Apply antibiotic ointment on the sting site as directed.  If you suffered a severe reaction:  If you did not require hospitalization, an adult will need to stay with you for 24 hours in case the symptoms return.  You may need to wear a medical bracelet or necklace stating the allergy.  You and your family need to learn when and how to use an anaphylaxis kit or epinephrine injection.  If you have had a severe reaction before, always carry your anaphylaxis kit with you. SEEK MEDICAL CARE IF:   None of the above helps within 2 to 3 days.  The area becomes red, warm, tender, and swollen beyond the area of the bite or sting.  You have an oral temperature above 102 F (38.9 C). SEEK IMMEDIATE MEDICAL CARE IF:  You have symptoms of an allergic reaction which are:  Wheezing.  Difficulty breathing.  Chest pain.  Lightheadedness or fainting.  Itchy, raised, red patches on the skin.  Nausea, vomiting, cramping or diarrhea. ANY OF THESE SYMPTOMS MAY REPRESENT A SERIOUS PROBLEM THAT IS AN EMERGENCY. Do not wait to see if the symptoms will go away. Get medical help right away. Call your local emergency services (911 in [place] DO NOT drive yourself to the hospital. MAKE SURE YOU:   Understand these instructions.  Will watch your condition.  Will get help right away if you are not doing well or get worse. Document Released: 03/03/2005 Document Revised: 05/26/2011 Document Reviewed: 08/18/2009 [facility] Patient Information 2015 [place], [place]. This information is not intended to replace  advice given to you by your health care provider. Make sure you discuss any questions you have with your health care provider.

## 2014-11-08 NOTE — ED Notes (Signed)
PT was pumping gas on Monday night, and was biten or stung by a insect.  It is swollen and red so she came to the ED for further follow up.

## 2018-03-17 NOTE — L&D Delivery Note (Signed)
OB/GYN Faculty Practice Delivery Note  Amber [name] is a 38 y.o. [id number] s/p VBAC at [redacted]w[redacted]d. She was admitted for spontaneous labor.   ROM: 0h 42m with clear fluid GBS Status: --Henderson Cloud (10/13 0147) Maximum Maternal Temperature: 98.7 F    Labor Progress: . Patient arrived at 6 cm dilation in MAU but was fully dilated on arrival to the floor. She had SROM during a contraction and then shortly after had NSVD.   Delivery Date/Time: 01/05/2019 at 0021 Delivery: Called to room and patient was complete and pushing. Head delivered in OA position. No nuchal cord present. Shoulder and body delivered in usual fashion. Infant with spontaneous cry, placed on mother's abdomen, dried and stimulated. Cord clamped x 2 after 1-minute delay, and cut by clinician. Cord blood drawn. Placenta delivered spontaneously with gentle cord traction. Fundus firm with massage and Pitocin. Labia, perineum, vagina, and cervix inspected with hemostatic bilateral periuretheral lacs.   Placenta: 3v, intact Complications: none Lacerations: bilateral periuretheral, hemostatic and not repaired EBL: 358 cc Analgesia: none   Infant: APGAR (1 MIN): 8   APGAR (5 MINS): 9    Weight: 2790 grams  Augustin Coupe, MD/[facility] OB/GYN Fellow, Faculty Practice

## 2018-09-27 ENCOUNTER — Ambulatory Visit (INDEPENDENT_AMBULATORY_CARE_PROVIDER_SITE_OTHER): Payer: Medicaid Other

## 2018-09-27 ENCOUNTER — Other Ambulatory Visit: Payer: Self-pay

## 2018-09-27 VITALS — BP 106/74 | HR 101 | Ht 61.0 in | Wt 134.0 lb

## 2018-09-27 DIAGNOSIS — Z3201 Encounter for pregnancy test, result positive: Secondary | ICD-10-CM | POA: Diagnosis not present

## 2018-09-27 DIAGNOSIS — Z3480 Encounter for supervision of other normal pregnancy, unspecified trimester: Secondary | ICD-10-CM | POA: Insufficient documentation

## 2018-09-27 LAB — POCT URINE PREGNANCY: Preg Test, Ur: POSITIVE — AB

## 2018-09-27 NOTE — Progress Notes (Signed)
Ms. Schraeder presents today for UPT. She has no unusual complaints.  LMP:03/31/2018     OBJECTIVE: Appears well, in no apparent distress.  OB History    Gravida  5   Para  3   Term  2   Preterm  1   AB  1   Living  3     SAB  0   TAB  0   Ectopic  0   Multiple  0   Live Births  2          Home UPT Result: POSITIVE In-Office UPT result: POSITIVE  I have reviewed the patient's medical, obstetrical, social, and family histories, and medications.   ASSESSMENT: Positive pregnancy test, unwanted/surprise pregnancy.  PLAN Prenatal care to be completed at: [facility]

## 2018-10-11 ENCOUNTER — Other Ambulatory Visit (HOSPITAL_COMMUNITY)
Admission: RE | Admit: 2018-10-11 | Discharge: 2018-10-11 | Disposition: A | Discharge status: Home / Self Care | Payer: Medicaid Other | Source: Ambulatory Visit | Attending: Family Medicine | Admitting: Family Medicine

## 2018-10-11 ENCOUNTER — Other Ambulatory Visit: Payer: Self-pay

## 2018-10-11 ENCOUNTER — Ambulatory Visit (INDEPENDENT_AMBULATORY_CARE_PROVIDER_SITE_OTHER): Payer: Medicaid Other | Admitting: Family Medicine

## 2018-10-11 VITALS — BP 109/73 | HR 92 | Temp 98.4°F | Wt 138.9 lb

## 2018-10-11 DIAGNOSIS — Z98891 History of uterine scar from previous surgery: Secondary | ICD-10-CM | POA: Insufficient documentation

## 2018-10-11 DIAGNOSIS — Z3480 Encounter for supervision of other normal pregnancy, unspecified trimester: Secondary | ICD-10-CM | POA: Insufficient documentation

## 2018-10-11 DIAGNOSIS — O0932 Supervision of pregnancy with insufficient antenatal care, second trimester: Secondary | ICD-10-CM | POA: Insufficient documentation

## 2018-10-11 DIAGNOSIS — O09522 Supervision of elderly multigravida, second trimester: Secondary | ICD-10-CM

## 2018-10-11 DIAGNOSIS — Z8751 Personal history of pre-term labor: Secondary | ICD-10-CM

## 2018-10-11 DIAGNOSIS — O09529 Supervision of elderly multigravida, unspecified trimester: Secondary | ICD-10-CM | POA: Insufficient documentation

## 2018-10-11 DIAGNOSIS — Z3A27 27 weeks gestation of pregnancy: Secondary | ICD-10-CM

## 2018-10-11 MED ORDER — BLOOD PRESSURE MONITOR KIT: 1.0000 | PACK | 0 refills | Status: DC

## 2018-10-11 MED ORDER — ASPIRIN EC 81 MG PO TBEC: 81.0000 mg | DELAYED_RELEASE_TABLET | Freq: Every day | ORAL | 2 refills | Status: DC

## 2018-10-11 NOTE — Progress Notes (Signed)
New OB late to care, unwanted pregnancy.   Reports no problems today.

## 2018-10-11 NOTE — Progress Notes (Signed)
Subjective:  Amber [name] is a [id number] 35w5dbeing seen today for her first obstetrical visit.  Her obstetrical history is significant for Prior preterm birth, cesarean section with first pregnancy due to failed IOL. She has had 2 successful VBACs. FOB involved. Patient does not intend to breast feed. Pregnancy history fully reviewed.  Patient reports no complaints.  BP 109/73   Pulse 92   Temp 98.4 F (36.9 C)   Wt 138 lb 14.4 oz (63 kg)   LMP 03/31/2018 (Approximate)   BMI 26.24 kg/m   HISTORY: OB History  Gravida Para Term Preterm AB Living  _0 SAB TAB Ectopic Multiple Live Births  0 0 0 0 2    # Outcome Date GA Lbr Len/2nd Weight Sex Delivery Anes PTL Lv  5 Current           4 Preterm 07/27/13 359w1d0:27 / 00:13 3 lb 15.3 oz (1.795 kg) F VBAC EPI  LIV  3 Term 10/23/11 389w4d:43 / 00:14 5 lb 14 oz (2.665 kg) F VBAC None  LIV  2 AB           1 Term             Past Medical History:  Diagnosis Date  . Anemia   . Anxiety   . [place] 1 disorder [place] Depression   . Headache   . Pregnancy induced hypertension     Past Surgical History:  Procedure Laterality Date  . CESAREAN SECTION      Family History  Problem Relation Age of Onset  . Arthritis Mother   . Hypertension Mother   . Alcohol abuse Neg Hx   . Asthma Neg Hx   . Birth defects Neg Hx   . Cancer Neg Hx   . COPD Neg Hx   . Depression Neg Hx   . Diabetes Neg Hx   . Drug abuse Neg Hx   . Early death Neg Hx   . Heart disease Neg Hx   . Hearing loss Neg Hx   . Hyperlipidemia Neg Hx   . Kidney disease Neg Hx   . Learning disabilities Neg Hx   . Mental illness Neg Hx   . Mental retardation Neg Hx   . Miscarriages / Stillbirths Neg Hx   . Stroke Neg Hx   . Vision loss Neg Hx   . Varicose Veins Neg Hx      Exam  BP 109/73   Pulse 92   Temp 98.4 F (36.9 C)   Wt 138 lb 14.4 oz (63 kg)   LMP 03/31/2018 (Approximate)   BMI 26.24 kg/m   CONSTITUTIONAL: Well-developed,  well-nourished female in no acute distress.  HENT:  Normocephalic, atraumatic, External right and left ear normal. Oropharynx is clear and moist EYES: Conjunctivae and EOM are normal. Pupils are equal, round, and reactive to light. No scleral icterus.  NECK: Normal range of motion, supple, no masses.  Normal thyroid.  CARDIOVASCULAR: Normal heart rate noted, regular rhythm RESPIRATORY: Clear to auscultation bilaterally. Effort and breath sounds normal, no problems with respiration noted. BREASTS: Symmetric in size. No masses, skin changes, nipple drainage, or lymphadenopathy. ABDOMEN: Soft, normal bowel sounds, no distention noted.  No tenderness, rebound or guarding.  PELVIC: Normal appearing external genitalia; normal appearing vaginal mucosa and cervix. No abnormal discharge noted. Normal uterine size, no other palpable masses, no uterine or adnexal tenderness. MUSCULOSKELETAL: Normal range of motion. No  tenderness.  No cyanosis, clubbing, or edema.  2+ distal pulses. SKIN: Skin is warm and dry. No rash noted. Not diaphoretic. No erythema. No pallor. NEUROLOGIC: Alert and oriented to person, place, and time. Normal reflexes, muscle tone coordination. No cranial nerve deficit noted. PSYCHIATRIC: Normal mood and affect. Normal behavior. Normal judgment and thought content.    Assessment:    Pregnancy: [id number] Patient Active Problem List   Diagnosis Date Noted  . AMA (advanced maternal age) multigravida 35+ 10/11/2018  . Late prenatal care affecting pregnancy in second trimester 10/11/2018  . Supervision of other normal pregnancy, antepartum 09/27/2018  . Bipolar 1 disorder ([place])       Plan:   1. Supervision of other normal pregnancy, antepartum FHT and FH normal. Measuring about 22-24 weeks. Will get [place]. - Enroll Patient in Babyscripts - Babyscripts Schedule Optimization - Cytology - PAP( [place]) - Cervicovaginal ancillary only( [place]) - Obstetric Panel, Including  HIV - Culture, OB Urine - Genetic Screening - Blood Pressure Monitor KIT; 1 kit by Does not apply route once a week. Check BP weekly,  Regular Cuff.  DX:   O09.90          Z13.6  Dispense: 1 kit; Refill: 0 - [place] MFM OB DETAIL +14 WK; Future  2. Multigravida of advanced maternal age in second trimester ASA 34m Daily. CMP, UP:C  - Enroll Patient in Babyscripts - Babyscripts Schedule Optimization - Cytology - PAP( [place]) - Cervicovaginal ancillary only( [place]) - Obstetric Panel, Including HIV - Culture, OB Urine - Genetic Screening - Blood Pressure Monitor KIT; 1 kit by Does not apply route once a week. Check BP weekly,  Regular Cuff.  DX:   O09.90          Z13.6  Dispense: 1 kit; Refill: 0 - [place] OB DET[place] +14 WK; Future  3. Late prenatal care affecting pregnancy in second trimester - Enroll Patient in Babyscripts - Babyscripts Schedule Optimization - Cytology - PAP( [place]) - Cervicovaginal ancillary only( [place]) - Obstetric Panel, Including HIV - Culture, OB Urine - Genetic Screening - Blood Pressure Monitor KIT; 1 kit by Does not apply route once a week. Check BP weekly,  Regular Cuff.  DX:   O09.90          Z13.6  Dispense: 1 kit; Refill: 0 - [place] OB DET[place] +14 WK; Future  4. H/O cesarean section Desires VBAC.  5. History of PReterm delivery 36 week delivery with last pregnancy.   Problem list reviewed and updated. 75% of 30 min visit spent on counseling and coordination of care.     JTruett Mainland7/27/2020

## 2018-10-13 LAB — CERVICOVAGINAL ANCILLARY ONLY
Bacterial vaginitis: NEGATIVE
Candida vaginitis: POSITIVE — AB
Chlamydia: NEGATIVE
Neisseria Gonorrhea: NEGATIVE
Trichomonas: POSITIVE — AB

## 2018-10-13 LAB — CYTOLOGY - PAP
Diagnosis: NEGATIVE
HPV: NOT DETECTED

## 2018-10-14 LAB — COMPREHENSIVE METABOLIC PANEL
ALT: 8 IU/L (ref 0–32)
AST: 15 IU/L (ref 0–40)
Albumin/Globulin Ratio: 1.8 (ref 1.2–2.2)
Albumin: 3.8 g/dL (ref 3.8–4.8)
Alkaline Phosphatase: 90 IU/L (ref 39–117)
BUN/Creatinine Ratio: 7 — ABNORMAL LOW (ref 9–23)
BUN: 5 mg/dL — ABNORMAL LOW (ref 6–20)
Bilirubin Total: 0.3 mg/dL (ref 0.0–1.2)
CO2: 23 mmol/L (ref 20–29)
Calcium: 8.6 mg/dL — ABNORMAL LOW (ref 8.7–10.2)
Chloride: 102 mmol/L (ref 96–106)
Creatinine, Ser: 0.73 mg/dL (ref 0.57–1.00)
GFR calc Af Amer: 122 mL/min/{1.73_m2} (ref >59)
GFR calc non Af Amer: 106 mL/min/{1.73_m2} (ref >59)
Globulin, Total: 2.1 g/dL (ref 1.5–4.5)
Glucose: 82 mg/dL (ref 65–99)
Potassium: 4 mmol/L (ref 3.5–5.2)
Sodium: 138 mmol/L (ref 134–144)
Total Protein: 5.9 g/dL — ABNORMAL LOW (ref 6.0–8.5)

## 2018-10-14 LAB — OBSTETRIC PANEL, INCLUDING HIV
Antibody Screen: NEGATIVE
Basophils Absolute: 0.1 10*3/uL (ref 0.0–0.2)
Basos: 1 %
EOS (ABSOLUTE): 0.1 10*3/uL (ref 0.0–0.4)
Eos: 1 %
HIV Screen 4th Generation wRfx: NONREACTIVE
Hematocrit: 29.9 % — ABNORMAL LOW (ref 34.0–46.6)
Hemoglobin: 10.6 g/dL — ABNORMAL LOW (ref 11.1–15.9)
Hepatitis B Surface Ag: NEGATIVE
Immature Grans (Abs): 0.2 10*3/uL — ABNORMAL HIGH (ref 0.0–0.1)
Immature Granulocytes: 2 %
Lymphocytes Absolute: 2.5 10*3/uL (ref 0.7–3.1)
Lymphs: 25 %
MCH: 33.1 pg — ABNORMAL HIGH (ref 26.6–33.0)
MCHC: 35.5 g/dL (ref 31.5–35.7)
MCV: 93 fL (ref 79–97)
Monocytes Absolute: 0.8 10*3/uL (ref 0.1–0.9)
Monocytes: 8 %
Neutrophils Absolute: 6.4 10*3/uL (ref 1.4–7.0)
Neutrophils: 63 %
Platelets: 162 10*3/uL (ref 150–450)
RBC: 3.2 x10E6/uL — ABNORMAL LOW (ref 3.77–5.28)
RDW: 12.8 % (ref 11.7–15.4)
RPR Ser Ql: NONREACTIVE
Rh Factor: POSITIVE
Rubella Antibodies, IGG: 1.3 index (ref >0.99)
WBC: 10 10*3/uL (ref 3.4–10.8)

## 2018-10-14 LAB — CULTURE, OB URINE

## 2018-10-14 LAB — PROTEIN / CREATININE RATIO, URINE
Creatinine, Urine: 148.9 mg/dL
Protein, Ur: 26.2 mg/dL
Protein/Creat Ratio: 176 mg/g creat (ref 0–200)

## 2018-10-14 LAB — URINE CULTURE, OB REFLEX

## 2018-10-15 ENCOUNTER — Other Ambulatory Visit: Payer: Self-pay | Admitting: Family Medicine

## 2018-10-15 MED ORDER — MICONAZOLE NITRATE 2 % VA CREA: 1.0000 | TOPICAL_CREAM | Freq: Every day | VAGINAL | 2 refills | Status: DC

## 2018-10-15 MED ORDER — CEFADROXIL 500 MG PO CAPS: 500.0000 mg | ORAL_CAPSULE | Freq: Two times a day (BID) | ORAL | 0 refills | Status: DC

## 2018-10-15 MED ORDER — SULFAMETHOXAZOLE-TRIMETHOPRIM 800-160 MG PO TABS: 1.0000 | ORAL_TABLET | Freq: Two times a day (BID) | ORAL | 1 refills | Status: DC

## 2018-10-15 MED ORDER — METRONIDAZOLE 500 MG PO TABS: 500.0000 mg | ORAL_TABLET | Freq: Two times a day (BID) | ORAL | 0 refills | Status: DC

## 2018-10-18 ENCOUNTER — Encounter: Payer: Self-pay | Admitting: Family Medicine

## 2018-10-20 ENCOUNTER — Ambulatory Visit (HOSPITAL_COMMUNITY)
Admission: RE | Admit: 2018-10-20 | Discharge: 2018-10-20 | Disposition: A | Discharge status: Home / Self Care | Payer: Medicaid Other | Source: Ambulatory Visit | Attending: Family Medicine | Admitting: Family Medicine

## 2018-10-20 ENCOUNTER — Encounter (HOSPITAL_COMMUNITY): Payer: Self-pay | Admitting: *Deleted

## 2018-10-20 ENCOUNTER — Ambulatory Visit (HOSPITAL_COMMUNITY): Payer: Medicaid Other | Admitting: *Deleted

## 2018-10-20 ENCOUNTER — Other Ambulatory Visit: Payer: Self-pay

## 2018-10-20 ENCOUNTER — Other Ambulatory Visit (HOSPITAL_COMMUNITY): Payer: Self-pay | Admitting: *Deleted

## 2018-10-20 DIAGNOSIS — O34219 Maternal care for unspecified type scar from previous cesarean delivery: Secondary | ICD-10-CM | POA: Diagnosis not present

## 2018-10-20 DIAGNOSIS — Z3A26 26 weeks gestation of pregnancy: Secondary | ICD-10-CM

## 2018-10-20 DIAGNOSIS — O0932 Supervision of pregnancy with insufficient antenatal care, second trimester: Secondary | ICD-10-CM | POA: Diagnosis not present

## 2018-10-20 DIAGNOSIS — O09522 Supervision of elderly multigravida, second trimester: Secondary | ICD-10-CM | POA: Diagnosis not present

## 2018-10-20 DIAGNOSIS — Z362 Encounter for other antenatal screening follow-up: Secondary | ICD-10-CM

## 2018-10-20 DIAGNOSIS — O99332 Smoking (tobacco) complicating pregnancy, second trimester: Secondary | ICD-10-CM

## 2018-10-20 DIAGNOSIS — Z3480 Encounter for supervision of other normal pregnancy, unspecified trimester: Secondary | ICD-10-CM | POA: Insufficient documentation

## 2018-10-20 DIAGNOSIS — O09212 Supervision of pregnancy with history of pre-term labor, second trimester: Secondary | ICD-10-CM

## 2018-10-20 DIAGNOSIS — O09292 Supervision of pregnancy with other poor reproductive or obstetric history, second trimester: Secondary | ICD-10-CM

## 2018-10-25 ENCOUNTER — Encounter: Payer: Self-pay | Admitting: Obstetrics and Gynecology

## 2018-10-25 ENCOUNTER — Encounter: Payer: Self-pay | Admitting: Family Medicine

## 2018-10-25 DIAGNOSIS — G129 Spinal muscular atrophy, unspecified: Secondary | ICD-10-CM | POA: Insufficient documentation

## 2018-11-08 ENCOUNTER — Ambulatory Visit (INDEPENDENT_AMBULATORY_CARE_PROVIDER_SITE_OTHER): Payer: Medicaid Other | Admitting: Obstetrics

## 2018-11-08 ENCOUNTER — Encounter: Payer: Self-pay | Admitting: Obstetrics

## 2018-11-08 DIAGNOSIS — Z8751 Personal history of pre-term labor: Secondary | ICD-10-CM

## 2018-11-08 DIAGNOSIS — O0993 Supervision of high risk pregnancy, unspecified, third trimester: Secondary | ICD-10-CM

## 2018-11-08 DIAGNOSIS — O0933 Supervision of pregnancy with insufficient antenatal care, third trimester: Secondary | ICD-10-CM

## 2018-11-08 DIAGNOSIS — Z3A28 28 weeks gestation of pregnancy: Secondary | ICD-10-CM

## 2018-11-08 DIAGNOSIS — Z98891 History of uterine scar from previous surgery: Secondary | ICD-10-CM

## 2018-11-08 DIAGNOSIS — O09523 Supervision of elderly multigravida, third trimester: Secondary | ICD-10-CM

## 2018-11-08 DIAGNOSIS — O0930 Supervision of pregnancy with insufficient antenatal care, unspecified trimester: Secondary | ICD-10-CM

## 2018-11-08 DIAGNOSIS — O0990 Supervision of high risk pregnancy, unspecified, unspecified trimester: Secondary | ICD-10-CM

## 2018-11-08 NOTE — Progress Notes (Signed)
[place] VIRTUAL VIDEO VISIT ENCOUNTER NOTE  Provider location: Center for [facility] at [place]   I connected with Amber [name] on 11/08/18 at  2:30 PM EDT by WebEx OB MyChart Video Encounter at home and verified that I am speaking with the correct person using two identifiers.   I discussed the limitations, risks, security and privacy concerns of performing an evaluation and management service virtually and the availability of in person appointments. I also discussed with the patient that there may be a patient responsible charge related to this service. The patient expressed understanding and agreed to proceed. Subjective:  Amber [name] is a 38 y.o. [phone] at [redacted]w[redacted]d being seen today for ongoing prenatal care.  She is currently monitored for the following issues for this high-risk pregnancy and has Bipolar 1 disorder ([place]); Supervision of other normal pregnancy, antepartum; AMA (advanced maternal age) multigravida 35+; Late prenatal care affecting pregnancy in second trimester; History of preterm delivery; History of VBAC; and Increased SMA risk on [place] on their problem list.  Patient reports no complaints.  Contractions: Not present. Vag. Bleeding: None.  Movement: Present. Denies any leaking of fluid.   The following portions of the patient's history were reviewed and updated as appropriate: allergies, current medications, past family history, past medical history, past social history, past surgical history and problem list.   Objective:  There were no vitals filed for this visit.  Fetal Status:     Movement: Present     General:  Alert, oriented and cooperative. Patient is in no acute distress.  Respiratory: Normal respiratory effort, no problems with respiration noted  Mental Status: Normal mood and affect. Normal behavior. Normal judgment and thought content.  Rest of physical exam deferred due to type of encounter  Imaging: [place] Mfm Ob Detail  +14 Wk  Result Date: 10/20/2018 ----------------------------------------------------------------------  OBSTETRICS REPORT                       (Signed Final 10/20/2018 11:07 am) ---------------------------------------------------------------------- Patient Info  ID #:       [id number]                          D.O.B.:  1980-11-18 (38 yrs)  Name:       Amber [name]                 Visit Date: 10/20/2018 09:58 am ---------------------------------------------------------------------- Performed By  Performed By:     Corky Crafts             Ref. Address:     [place]  [id number]  Attending:        Noralee Spaceavi Shankar MD        [facility]  Referred By:      Levie HeritageJACOB J STINSON                    MD ---------------------------------------------------------------------- Orders   #  Description                          Code         Ordered By   1  US MFM OB DETAIL +14 WK              76811.01     Candelaria CelesteJACOB STINSON  ----------------------------------------------------------------------   #  Order #                    Accession #                 Episode #   1  [id number]                  [id number]                  [id number]  ---------------------------------------------------------------------- Indications   Advanced maternal age multigravida 7035+,        48O09.522   second trimester   3226 weeks gestation of pregnancy                Z3A.26   Encounter for antenatal screening for          Z36.3   malformations (low risk NIPS)   Late prenatal care, second trimester           O09.32   Poor obstetric history: Previous preterm       O09.219   delivery, antepartum (3670w1d)   History of cesarean delivery, currently        O34.219    pregnant   Smoking complicating pregnancy, second         O99.332   trimester (0.5 pk/day)   Poor obstetric history: Previous               O09.299   preeclampsia / eclampsia/gestational HTN   (1st pregnancy, ASA)  ---------------------------------------------------------------------- Vital Signs  Weight (lb): 138                               Height:        5'1"  BMI:         26.07 ---------------------------------------------------------------------- Fetal Evaluation  Num Of Fetuses:         1  Fetal Heart Rate(bpm):  149  Cardiac Activity:       Observed  Presentation:           Cephalic  Placenta:               Anterior  P. Cord Insertion:      Visualized  Amniotic Fluid  AFI FV:  Within normal limits                              Largest Pocket(cm)                              5.26 ---------------------------------------------------------------------- Biometry  BPD:      60.5  mm     G. Age:  24w 4d          5  %    CI:        66.56   %    70 - 86                                                          FL/HC:      20.7   %    18.6 - 20.4  HC:      237.7  mm     G. Age:  25w 6d         16  %    HC/AC:      1.02        1.04 - 1.22  AC:      233.3  mm     G. Age:  27w 5d         84  %    FL/BPD:     81.5   %    71 - 87  FL:       49.3  mm     G. Age:  26w 4d         50  %    FL/AC:      21.1   %    20 - 24  HUM:      45.4  mm     G. Age:  26w 6d         63  %  CER:      30.6  mm     G. Age:  26w 6d         65  %  LV:        2.9  mm  CM:        2.8  mm  Est. FW:    1002  gm      2 lb 3 oz     72  % ---------------------------------------------------------------------- OB History  Gravidity:    5         Term:   2        Prem:   1        SAB:   1  Living:       3 ---------------------------------------------------------------------- Gestational Age  LMP:           29w 0d        Date:  03/31/18                 EDD:   01/05/19  U/S Today:     26w 1d                                        EDD:   01/25/19  Best:           26w 1d     Det. By:  U/S (10/20/18)           EDD:   01/25/19 ---------------------------------------------------------------------- Anatomy  Cranium:               Appears normal         LVOT:                   Not well visualized  Cavum:                 Appears normal         Aortic Arch:            Not well visualized  Ventricles:            Appears normal         Ductal Arch:            Not well visualized  Choroid Plexus:        Appears normal         Diaphragm:              Appears normal  Cerebellum:            Appears normal         Stomach:                Appears normal, left                                                                        sided  Posterior Fossa:       Appears normal         Abdomen:                Appears normal  Nuchal Fold:           Not applicable (>20    Abdominal Wall:         Appears nml (cord                         wks GA)                                        insert, abd wall)  Face:                  Appears normal         Cord Vessels:           Appears normal (3                         (orbits and profile)                           vessel cord)  Lips:                  Appears normal         Kidneys:  Appear normal  Palate:                Not well visualized    Bladder:                Appears normal  Thoracic:              Appears normal         Spine:                  Ltd views no                                                                        intracranial signs of                                                                        NTD  Heart:                 Not well visualized    Upper Extremities:      RUE appears nl;                                                                        LUE nwv  RVOT:                  Not well visualized    Lower Extremities:      Appears normal  Other:  Female gender. Heels visualized. Nasal bone visualized. Technically          difficult due to fetal position.  ---------------------------------------------------------------------- Cervix Uterus Adnexa  Cervix  Length:           4.19  cm.  Normal appearance by transabdominal scan.  Uterus  No abnormality visualized.  Left Ovary  Size(cm)        3   x   1.3    x  1.4       Vol(ml): 2.86  Within normal limits.  Right Ovary  Size(cm)       3.3  x   1.7    x  1.2       Vol(ml): 3.52  Within normal limits. ---------------------------------------------------------------------- Impression  Late prenatal care. Unsure of LMP dates.  On cell-free fetal DNA screening, the risks of fetal  aneuploidies are not increased.  We performed fetal anatomy scan. No makers of  aneuploidies or fetal structural defects are seen. Fetal  biometry is consistent with 26w 1d gestation. Amniotic fluid is  normal and good fetal activity is seen. Patient understands  the limitations of ultrasound in detecting fetal anomalies.  We have assigned her EDD at 01/25/2019. ---------------------------------------------------------------------- Recommendations  An appointment was made for her to return in  4 weeks for  completion of fetal anatomy and to assess interval growth. ----------------------------------------------------------------------                  Noralee Spaceavi Shankar, MD Electronically Signed Final Report   10/20/2018 11:07 am ----------------------------------------------------------------------   Assessment and Plan:  Pregnancy: [id number] at 1236w6d 1. Supervision of high risk pregnancy, antepartum  2. Multigravida of advanced maternal age in third trimester  3. Late prenatal care affecting pregnancy, antepartum  4. History of preterm delivery  5. H/O cesarean section   Preterm labor symptoms and general obstetric precautions including but not limited to vaginal bleeding, contractions, leaking of fluid and fetal movement were reviewed in detail with the patient. I discussed the assessment and treatment plan with the patient. The patient was  provided an opportunity to ask questions and all were answered. The patient agreed with the plan and demonstrated an understanding of the instructions. The patient was advised to call back or seek an in-person office evaluation/[facility] for any urgent or concerning symptoms. Please refer to After Visit Summary for other counseling recommendations.   I provided 10 minutes of face-to-face time during this encounter.  Return in about 2 weeks (around 11/22/2018) for WebEx.  Future Appointments  Date Time Provider Department Center  11/08/2018  2:30 PM Brock BadHarper, Ayo Smoak A, MD [facility] None  11/10/2018  8:30 AM [facility] LAB [facility] None  11/17/2018 11:15 AM [facility] NURSE [facility]  11/17/2018 11:15 AM [facility] US 4 [facility]-US    Coral Ceoharles Kyce Ging, MD [facility] 11/08/2018

## 2018-11-08 NOTE — Progress Notes (Signed)
Pt is on the phone preparing for virtual visit with provider. [redacted]w[redacted]d. Pt reports she has not received her BP cuff yet, I advised pt to get her BP checked when she comes for her glucose test on Wednesday. Pt denies HA's, blurry vision, or swelling.

## 2018-11-10 ENCOUNTER — Other Ambulatory Visit: Payer: Self-pay

## 2018-11-10 ENCOUNTER — Other Ambulatory Visit: Payer: Medicaid Other

## 2018-11-10 DIAGNOSIS — O0990 Supervision of high risk pregnancy, unspecified, unspecified trimester: Secondary | ICD-10-CM

## 2018-11-10 DIAGNOSIS — O24419 Gestational diabetes mellitus in pregnancy, unspecified control: Secondary | ICD-10-CM

## 2018-11-11 LAB — GLUCOSE TOLERANCE, 2 HOURS W/ 1HR
Glucose, 1 hour: 183 mg/dL — ABNORMAL HIGH (ref 65–179)
Glucose, 2 hour: 91 mg/dL (ref 65–152)
Glucose, Fasting: 83 mg/dL (ref 65–91)

## 2018-11-11 LAB — CBC
Hematocrit: 31.1 % — ABNORMAL LOW (ref 34.0–46.6)
Hemoglobin: 10.3 g/dL — ABNORMAL LOW (ref 11.1–15.9)
MCH: 32.1 pg (ref 26.6–33.0)
MCHC: 33.1 g/dL (ref 31.5–35.7)
MCV: 97 fL (ref 79–97)
Platelets: 134 10*3/uL — ABNORMAL LOW (ref 150–450)
RBC: 3.21 x10E6/uL — ABNORMAL LOW (ref 3.77–5.28)
RDW: 12.6 % (ref 11.7–15.4)
WBC: 9.7 10*3/uL (ref 3.4–10.8)

## 2018-11-11 LAB — RPR: RPR Ser Ql: NONREACTIVE

## 2018-11-11 LAB — HIV ANTIBODY (ROUTINE TESTING W REFLEX): HIV Screen 4th Generation wRfx: NONREACTIVE

## 2018-11-17 ENCOUNTER — Ambulatory Visit (HOSPITAL_COMMUNITY): Payer: Medicaid Other | Admitting: *Deleted

## 2018-11-17 ENCOUNTER — Encounter (HOSPITAL_COMMUNITY): Payer: Self-pay

## 2018-11-17 ENCOUNTER — Other Ambulatory Visit: Payer: Self-pay

## 2018-11-17 ENCOUNTER — Other Ambulatory Visit (HOSPITAL_COMMUNITY): Payer: Self-pay | Admitting: *Deleted

## 2018-11-17 ENCOUNTER — Ambulatory Visit (HOSPITAL_COMMUNITY)
Admission: RE | Admit: 2018-11-17 | Discharge: 2018-11-17 | Disposition: A | Discharge status: Home / Self Care | Payer: Medicaid Other | Source: Ambulatory Visit | Attending: Obstetrics and Gynecology | Admitting: Obstetrics and Gynecology

## 2018-11-17 VITALS — BP 108/80 | HR 87 | Temp 97.9°F

## 2018-11-17 DIAGNOSIS — O09293 Supervision of pregnancy with other poor reproductive or obstetric history, third trimester: Secondary | ICD-10-CM

## 2018-11-17 DIAGNOSIS — O09523 Supervision of elderly multigravida, third trimester: Secondary | ICD-10-CM

## 2018-11-17 DIAGNOSIS — O99330 Smoking (tobacco) complicating pregnancy, unspecified trimester: Secondary | ICD-10-CM

## 2018-11-17 DIAGNOSIS — O0933 Supervision of pregnancy with insufficient antenatal care, third trimester: Secondary | ICD-10-CM

## 2018-11-17 DIAGNOSIS — O34219 Maternal care for unspecified type scar from previous cesarean delivery: Secondary | ICD-10-CM

## 2018-11-17 DIAGNOSIS — O99333 Smoking (tobacco) complicating pregnancy, third trimester: Secondary | ICD-10-CM

## 2018-11-17 DIAGNOSIS — O09213 Supervision of pregnancy with history of pre-term labor, third trimester: Secondary | ICD-10-CM

## 2018-11-17 DIAGNOSIS — Z3A30 30 weeks gestation of pregnancy: Secondary | ICD-10-CM

## 2018-11-17 DIAGNOSIS — Z362 Encounter for other antenatal screening follow-up: Secondary | ICD-10-CM | POA: Insufficient documentation

## 2018-11-18 ENCOUNTER — Other Ambulatory Visit: Payer: Self-pay

## 2018-11-18 MED ORDER — BLOOD PRESSURE KIT DEVI: 1.0000 | 0 refills | Status: DC | PRN

## 2018-11-18 NOTE — Progress Notes (Signed)
Pt did not receive her BP cuff. BP cuff is on backorder. Resending order to [employer].

## 2018-11-23 ENCOUNTER — Encounter: Payer: Medicaid Other | Admitting: Obstetrics

## 2018-11-24 ENCOUNTER — Ambulatory Visit (INDEPENDENT_AMBULATORY_CARE_PROVIDER_SITE_OTHER): Payer: Medicaid Other | Admitting: Obstetrics

## 2018-11-24 ENCOUNTER — Encounter: Payer: Self-pay | Admitting: Obstetrics

## 2018-11-24 DIAGNOSIS — O34219 Maternal care for unspecified type scar from previous cesarean delivery: Secondary | ICD-10-CM

## 2018-11-24 DIAGNOSIS — Z3A31 31 weeks gestation of pregnancy: Secondary | ICD-10-CM

## 2018-11-24 DIAGNOSIS — O09213 Supervision of pregnancy with history of pre-term labor, third trimester: Secondary | ICD-10-CM | POA: Diagnosis not present

## 2018-11-24 DIAGNOSIS — O0933 Supervision of pregnancy with insufficient antenatal care, third trimester: Secondary | ICD-10-CM

## 2018-11-24 DIAGNOSIS — O0930 Supervision of pregnancy with insufficient antenatal care, unspecified trimester: Secondary | ICD-10-CM

## 2018-11-24 DIAGNOSIS — O09523 Supervision of elderly multigravida, third trimester: Secondary | ICD-10-CM | POA: Diagnosis not present

## 2018-11-24 DIAGNOSIS — O0993 Supervision of high risk pregnancy, unspecified, third trimester: Secondary | ICD-10-CM

## 2018-11-24 DIAGNOSIS — O0990 Supervision of high risk pregnancy, unspecified, unspecified trimester: Secondary | ICD-10-CM

## 2018-11-24 DIAGNOSIS — Z8751 Personal history of pre-term labor: Secondary | ICD-10-CM

## 2018-11-24 DIAGNOSIS — Z98891 History of uterine scar from previous surgery: Secondary | ICD-10-CM

## 2018-11-24 NOTE — Progress Notes (Signed)
Virtual ROB   CC: None  

## 2018-11-24 NOTE — Progress Notes (Signed)
   TELE[facility]ALTH VIRTUAL OBSTETRICS VISIT ENCOUNTER NOTE  I connected with Amber [name] on 11/24/18 at  2:15 PM EDT by telephone at home and verified that I am speaking with the correct person using two identifiers.   I discussed the limitations, risks, security and privacy concerns of performing an evaluation and management service by telephone and the availability of in person appointments. I also discussed with the patient that there may be a patient responsible charge related to this service. The patient expressed understanding and agreed to proceed.  Subjective:  Amber [name] is a 38 y.o. [phone] at [redacted]w[redacted]d being followed for ongoing prenatal care.  She is currently monitored for the following issues for this high-risk pregnancy and has Bipolar 1 disorder ([place]); Supervision of other normal pregnancy, antepartum; AMA (advanced maternal age) multigravida 35+; Late prenatal care affecting pregnancy in second trimester; History of preterm delivery; History of VBAC; and Increased SMA risk on [place] on their problem list.  Patient reports no complaints. Reports fetal movement. Denies any contractions, bleeding or leaking of fluid.   The following portions of the patient's history were reviewed and updated as appropriate: allergies, current medications, past family history, past medical history, past social history, past surgical history and problem list.   Objective:   General:  Alert, oriented and cooperative.   Mental Status: Normal mood and affect perceived. Normal judgment and thought content.  Rest of physical exam deferred due to type of encounter  Assessment and Plan:  Pregnancy: [id number] at [redacted]w[redacted]d 1. Supervision of high risk pregnancy, antepartum  2. Multigravida of advanced maternal age in third trimester  3. Late prenatal care affecting pregnancy, antepartum  4. History of preterm delivery  5. H/O cesarean section - DESIRES VBAC  6. Hx successful VBAC (vaginal birth after  cesarean), currently pregnant   Preterm labor symptoms and general obstetric precautions including but not limited to vaginal bleeding, contractions, leaking of fluid and fetal movement were reviewed in detail with the patient.  I discussed the assessment and treatment plan with the patient. The patient was provided an opportunity to ask questions and all were answered. The patient agreed with the plan and demonstrated an understanding of the instructions. The patient was advised to call back or seek an in-person office evaluation/go to MAU at [facility] for any urgent or concerning symptoms. Please refer to After Visit Summary for other counseling recommendations.   I provided 10 minutes of non-face-to-face time during this encounter.  Return in about 2 weeks (around 12/08/2018) for WebEx.  HOB patient.  Future Appointments  Date Time Provider [place]  12/08/2018  4:00 PM Sloan Leiter, MD [place] None  12/29/2018 11:00 AM [place] NURSE [place] [facility]  12/29/2018 11:00 AM [place] 3 [facility]-US    Baltazar Najjar, [place] for [facility], [place] 11/24/2018

## 2018-12-02 ENCOUNTER — Telehealth: Payer: Self-pay | Admitting: Obstetrics & Gynecology

## 2018-12-02 ENCOUNTER — Other Ambulatory Visit: Payer: Self-pay

## 2018-12-02 DIAGNOSIS — O24419 Gestational diabetes mellitus in pregnancy, unspecified control: Secondary | ICD-10-CM | POA: Insufficient documentation

## 2018-12-02 HISTORY — DX: Gestational diabetes mellitus in pregnancy, unspecified control: O24.419

## 2018-12-02 MED ORDER — ACCU-CHEK GUIDE W/DEVICE KIT: 1.0000 | PACK | Freq: Four times a day (QID) | 0 refills | Status: DC

## 2018-12-02 MED ORDER — ACCU-CHEK GUIDE VI STRP: ORAL_STRIP | 12 refills | Status: DC

## 2018-12-02 MED ORDER — ACCU-CHEK FASTCLIX LANCETS MISC: 1.0000 [IU] | Freq: Four times a day (QID) | 12 refills | Status: DC

## 2018-12-02 NOTE — Telephone Encounter (Signed)
I instructed nursing to make referral to diabetes education

## 2018-12-02 NOTE — Progress Notes (Signed)
Referral sent as directed and supplies sent  Pt made aware and voiced understanding.

## 2018-12-08 ENCOUNTER — Encounter: Payer: Self-pay | Admitting: Obstetrics

## 2018-12-08 ENCOUNTER — Ambulatory Visit (INDEPENDENT_AMBULATORY_CARE_PROVIDER_SITE_OTHER): Payer: Medicaid Other | Admitting: Obstetrics

## 2018-12-08 VITALS — BP 111/81 | HR 88

## 2018-12-08 DIAGNOSIS — O09213 Supervision of pregnancy with history of pre-term labor, third trimester: Secondary | ICD-10-CM

## 2018-12-08 DIAGNOSIS — O0933 Supervision of pregnancy with insufficient antenatal care, third trimester: Secondary | ICD-10-CM

## 2018-12-08 DIAGNOSIS — Z3A33 33 weeks gestation of pregnancy: Secondary | ICD-10-CM

## 2018-12-08 DIAGNOSIS — M549 Dorsalgia, unspecified: Secondary | ICD-10-CM

## 2018-12-08 DIAGNOSIS — O34219 Maternal care for unspecified type scar from previous cesarean delivery: Secondary | ICD-10-CM

## 2018-12-08 DIAGNOSIS — O0930 Supervision of pregnancy with insufficient antenatal care, unspecified trimester: Secondary | ICD-10-CM

## 2018-12-08 DIAGNOSIS — O09523 Supervision of elderly multigravida, third trimester: Secondary | ICD-10-CM

## 2018-12-08 DIAGNOSIS — Z8751 Personal history of pre-term labor: Secondary | ICD-10-CM

## 2018-12-08 DIAGNOSIS — Z98891 History of uterine scar from previous surgery: Secondary | ICD-10-CM

## 2018-12-08 DIAGNOSIS — O0993 Supervision of high risk pregnancy, unspecified, third trimester: Secondary | ICD-10-CM

## 2018-12-08 DIAGNOSIS — O24419 Gestational diabetes mellitus in pregnancy, unspecified control: Secondary | ICD-10-CM

## 2018-12-08 DIAGNOSIS — O0990 Supervision of high risk pregnancy, unspecified, unspecified trimester: Secondary | ICD-10-CM

## 2018-12-08 MED ORDER — COMFORT FIT MATERNITY SUPP SM MISC: 0 refills | Status: DC

## 2018-12-08 NOTE — Progress Notes (Addendum)
TELEHEALTH OBSTETRICS PRENATAL VIRTUAL VIDEO VISIT ENCOUNTER NOTE  [facility]   I connected with Amber [name] on 12/08/18 at  4:00 PM EDT by WebEx OB  Video Encounter at home and verified that I am speaking with the correct person using two identifiers.   I discussed the limitations, risks, security and privacy concerns of performing an evaluation and management service virtually and the availability of in person appointments. I also discussed with the patient that there may be a patient responsible charge related to this service. The patient expressed understanding and agreed to proceed.  Subjective:  Amber [name] is a 38 y.o. [phone] at 2441w1d being seen today for ongoing prenatal care.  She is currently monitored for the following issues for this high-risk pregnancy and has Bipolar 1 disorder (HCC); Supervision of other normal pregnancy, antepartum; AMA (advanced maternal age) multigravida 35+; Late prenatal care affecting pregnancy in second trimester; History of preterm delivery; History of VBAC; Increased SMA risk on Horizon; and Gestational diabetes on their problem list.  Patient reports backache.  Contractions: Not present. Vag. Bleeding: None.  Movement: Present. Denies any leaking of fluid.   The following portions of the patient's history were reviewed and updated as appropriate: allergies, current medications, past family history, past medical history, past social history, past surgical history and problem list.   Objective:   Vitals:   12/08/18 1516  BP: 111/81  Pulse: 88    Fetal Status:     Movement: Present     General:  Alert, oriented and cooperative. Patient is in no acute distress.  Respiratory: Normal respiratory effort, no problems with respiration noted  Mental Status: Normal mood and affect. Normal behavior. Normal judgment and thought content.  Rest of physical exam deferred due to type of  encounter  Imaging: [place] Mfm Ob Follow Up  Result Date: 11/17/2018 ----------------------------------------------------------------------  OBSTETRICS REPORT                       (Signed Final 11/17/2018 12:05 pm) ---------------------------------------------------------------------- Patient Info  ID #:       [id number]                          D.O.B.:  1980/04/28 (38 yrs)  Name:       Amber [name]                 Visit Date: 11/17/2018 11:47 am ---------------------------------------------------------------------- Performed By  Performed By:     Earley BrookeNicole S Dalrymple     Ref. Address:     [address]                    [place], [address]                                                             [address]  Jacky Kindle                                                             [phone]  Attending:        Noralee Space MD        [facility]  Referred By:      Levie Heritage                    MD ---------------------------------------------------------------------- Orders   #  Description                          Code         Ordered By   1  [place] MFM OB FOLLOW UP                  [phone]     Noralee Space  ----------------------------------------------------------------------   #  Order #                    Accession #                 Episode #   1  [id number]                  [id number]                  [id number]  ---------------------------------------------------------------------- Indications   Advanced maternal age multigravida 1+,        O72.522   second trimester   [redacted] weeks gestation of pregnancy                Z3A.30   Encounter for antenatal screening for          Z36.3   malformations (low risk NIPS)   Late prenatal care, second trimester           O09.32   Poor obstetric history: Previous preterm       O09.219   delivery, antepartum ([redacted]w[redacted]d)   History of cesarean  delivery, currently        O34.219   pregnant   Smoking complicating pregnancy, second         O99.332   trimester (0.5 pk/day)   Poor obstetric history: Previous               O09.299   preeclampsia / eclampsia/gestational HTN   (1st pregnancy, ASA)  ---------------------------------------------------------------------- Vital Signs                                                 Height:        5'1" ---------------------------------------------------------------------- Fetal Evaluation  Num Of Fetuses:  1  Fetal Heart Rate(bpm):  149  Cardiac Activity:       Observed  Presentation:           Cephalic  Placenta:               Anterior  P. Cord Insertion:      Previously Visualized  Amniotic Fluid  AFI FV:      Within normal limits  AFI Sum(cm)     %Tile       Largest Pocket(cm)  14.8            52          5.8  RUQ(cm)       RLQ(cm)       LUQ(cm)        LLQ(cm)  5.8           4.16          1.09           3.75 ---------------------------------------------------------------------- Biometry  BPD:      71.9  mm     G. Age:  28w 6d          8  %    CI:        68.04   %    70 - 86                                                          FL/HC:      20.4   %    19.2 - 21.4  HC:      278.8  mm     G. Age:  30w 4d         25  %    HC/AC:      1.00        0.99 - 1.21  AC:      279.3  mm     G. Age:  32w 0d         91  %    FL/BPD:     79.0   %    71 - 87  FL:       56.8  mm     G. Age:  29w 6d         26  %    FL/AC:      20.3   %    20 - 24  HUM:      51.4  mm     G. Age:  30w 0d         51  %  LV:        4.5  mm  Est. FW:    1662  gm    3 lb 11 oz      65  % ---------------------------------------------------------------------- OB History  Gravidity:    5         Term:   2        Prem:   1        SAB:   1  Living:       3 ---------------------------------------------------------------------- Gestational Age  LMP:           33w 0d        Date:  03/31/18  EDD:   01/05/19  U/S Today:     30w 2d                                         EDD:   01/24/19  Best:          30w 1d     Det. By:  U/S  (10/20/18)          EDD:   01/25/19 ---------------------------------------------------------------------- Anatomy  Cranium:               Appears normal         LVOT:                   Appears normal  Cavum:                 Previously seen        Aortic Arch:            Appears normal  Ventricles:            Appears normal         Ductal Arch:            Not well visualized  Choroid Plexus:        Previously seen        Diaphragm:              Appears normal  Cerebellum:            Previously seen        Stomach:                Appears normal, left                                                                        sided  Posterior Fossa:       Previously seen        Abdomen:                Appears normal  Nuchal Fold:           Not applicable (>20    Abdominal Wall:         Previously seen                         wks GA)  Face:                  Orbits and profile     Cord Vessels:           Previously seen                         previously seen  Lips:                  Previously seen        Kidneys:                Appear normal  Palate:                Not well visualized    Bladder:  Appears normal  Thoracic:              Appears normal         Spine:                  Ltd views no                                                                        intracranial signs of                                                                        NTD  Heart:                 Appears normal         Upper Extremities:      Rue Previously                         (4CH, axis, and                                seen. LUE wnl.                         situs)  RVOT:                  Appears normal         Lower Extremities:      Previously seen  Other:  Female gender previously. Heels, and Nasal bone previously seen.          Technically difficult due to fetal position.  ---------------------------------------------------------------------- Cervix Uterus Adnexa  Cervix  Not visualized (advanced GA >24wks)  Left Ovary  Previously seen.  Right Ovary  Previously seen ---------------------------------------------------------------------- Impression  Patient returned for completion of fetal anatomy.  Amniotic fluid is normal and good fetal activity is seen. Fetal  growth is appropriate for gestational age. Fetal anatomical  survey was completed (as possible) and appears normal. ---------------------------------------------------------------------- Recommendations  -An appointment was made for her to return in 6 weeks for  fetal growth assessment. ----------------------------------------------------------------------                  Noralee Space, MD Electronically Signed Final Report   11/17/2018 12:05 pm ----------------------------------------------------------------------   Assessment and Plan:  Pregnancy: [id number] at [redacted]w[redacted]d 1. Supervision of high risk pregnancy, antepartum  2. Multigravida of advanced maternal age in third trimester  3. Gestational diabetes mellitus (GDM), antepartum, gestational diabetes method of control unspecified  4. Late prenatal care affecting pregnancy, antepartum  5. History of preterm delivery  6. H/O cesarean section  7. Hx successful VBAC (vaginal birth after cesarean), currently pregnant  8. Backache symptom Rx: - Elastic Bandages & Supports (COMFORT FIT MATERNITY SUPP SM) MISC; Wear as directed.  Dispense: 1 each; Refill: 0   Preterm labor symptoms and general obstetric  precautions including but not limited to vaginal bleeding, contractions, leaking of fluid and fetal movement were reviewed in detail with the patient. I discussed the assessment and treatment plan with the patient. The patient was provided an opportunity to ask questions and all were answered. The patient agreed with the plan and demonstrated an understanding of  the instructions. The patient was advised to call back or seek an in-person office evaluation/go to [facility] at [facility] for any urgent or concerning symptoms. Please refer to After Visit Summary for other counseling recommendations.   I provided 10 minutes of face-to-face time during this encounter.  Return in about 2 weeks (around 12/22/2018) for ROB.  High risk patient.  Needs to sign BTL papers and TOLAC consent..  Future Appointments  Date Time Provider [place]  12/08/2018  4:00 PM Shelly Bombard, MD [place] None  12/14/2018 10:15 AM [place]  12/29/2018 11:00 AM [place] NURSE [place] [facility]-US  12/29/2018 11:00 AM [facility] [place] 3 WH-MFCUS MFC-US    Baltazar Najjar, MD  12/08/2018    [facility] [place] [place] connected with  Amber [name] on 12/08/18 by a video enabled telemedicine application and verified that I am speaking with the correct person using two identifiers.   WebEx OB.  C/o pain in the middle of her buttocks 7/10 x 2 weeks, dry mouth and constant thirstiness x 2 weeks.  Diabetic Teaching 12/14/2018.

## 2018-12-13 ENCOUNTER — Telehealth: Payer: Self-pay | Admitting: Family Medicine

## 2018-12-13 NOTE — Telephone Encounter (Signed)
Spoke to patient about her appointment on 9/29 @ 10:15. Patient instructed to wear a face mask for the entire appointment and no visitors are allowed with her during the visit. Patient screened for covid symptoms and denied having any. °

## 2018-12-14 ENCOUNTER — Encounter: Payer: Medicaid Other | Attending: Obstetrics & Gynecology | Admitting: *Deleted

## 2018-12-14 ENCOUNTER — Ambulatory Visit: Payer: Medicaid Other | Admitting: *Deleted

## 2018-12-14 ENCOUNTER — Other Ambulatory Visit: Payer: Self-pay

## 2018-12-14 DIAGNOSIS — O24419 Gestational diabetes mellitus in pregnancy, unspecified control: Secondary | ICD-10-CM | POA: Insufficient documentation

## 2018-12-14 DIAGNOSIS — Z3A00 Weeks of gestation of pregnancy not specified: Secondary | ICD-10-CM | POA: Diagnosis not present

## 2018-12-14 NOTE — Progress Notes (Signed)
  Patient was seen on 12/14/2018 for Gestational Diabetes self-management. EDD 01/25/2019. Patient states history of GDM with first baby 9 years ago but not the next 2.  Diet history obtained. Patient eats fair variety of all food groups. Beverages include water, large glass OJ daily, occasionally regular soda.  She works at the [employer] and her work hours vary each week. The following learning objectives were met by the patient :   States the definition of Gestational Diabetes  States why dietary management is important in controlling blood glucose  Describes the effects of carbohydrates on blood glucose levels  Demonstrates ability to create a balanced meal plan  Demonstrates carbohydrate counting   States when to check blood glucose levels  Demonstrates proper blood glucose monitoring techniques  States the effect of stress and exercise on blood glucose levels  States the importance of limiting caffeine and abstaining from alcohol and smoking  Plan:  Aim for 3 Carb Choices per meal (45 grams) +/- 1 either way  Aim for 1-2 Carbs per snack Begin reading food labels for Total Carbohydrate of foods If OK with your MD, consider  increasing your activity level by walking, Arm Chair Exercises or other activity daily as tolerated Begin checking BG before breakfast and 2 hours after first bite of breakfast, lunch and dinner as directed by MD  Bring Log Book/Sheet and meter to every medical appointment OR use Baby Scripts (see below) Baby Scripts:  Patient was introduced to [employer] and plans to use as record of BG electronically  Take medication if directed by MD  Patient already has a meter: Accu Chek Guide, but she does not know how to use it yet.  Patient instructed to test pre breakfast and 2 hours each meal as directed by MD She tested today: her FBG was 77 mg/dl  Patient instructed to monitor glucose levels: FBS: 60 - 95 mg/dl 2 hour: <120 mg/dl  Patient received the  following handouts:  Nutrition Diabetes and Pregnancy  Carbohydrate Counting List  Patient will be seen for follow-up as needed.

## 2018-12-15 ENCOUNTER — Telehealth: Payer: Self-pay | Admitting: *Deleted

## 2018-12-15 NOTE — Telephone Encounter (Signed)
Wells Guiles from [employer] left voice message regarding elevated blood pressure reading of 130/93.     Sent patient a MyChart message requesting she recheck her blood pressure and if she is having any symptoms. Will send to provider for review.  Derl Barrow, RN

## 2018-12-28 ENCOUNTER — Ambulatory Visit (INDEPENDENT_AMBULATORY_CARE_PROVIDER_SITE_OTHER): Payer: Medicaid Other | Admitting: Advanced Practice Midwife

## 2018-12-28 ENCOUNTER — Other Ambulatory Visit (HOSPITAL_COMMUNITY)
Admission: RE | Admit: 2018-12-28 | Discharge: 2018-12-28 | Disposition: A | Discharge status: Home / Self Care | Payer: Medicaid Other | Source: Ambulatory Visit | Attending: Advanced Practice Midwife | Admitting: Advanced Practice Midwife

## 2018-12-28 ENCOUNTER — Other Ambulatory Visit: Payer: Self-pay

## 2018-12-28 VITALS — BP 109/77 | HR 112 | Wt 142.4 lb

## 2018-12-28 DIAGNOSIS — R12 Heartburn: Secondary | ICD-10-CM

## 2018-12-28 DIAGNOSIS — Z3A36 36 weeks gestation of pregnancy: Secondary | ICD-10-CM

## 2018-12-28 DIAGNOSIS — O26893 Other specified pregnancy related conditions, third trimester: Secondary | ICD-10-CM

## 2018-12-28 DIAGNOSIS — Z3480 Encounter for supervision of other normal pregnancy, unspecified trimester: Secondary | ICD-10-CM

## 2018-12-28 NOTE — Progress Notes (Signed)
   PRENATAL VISIT NOTE  Subjective:  Amber [name] is a 38 y.o. [phone] at [redacted]w[redacted]d being seen today for ongoing prenatal care.  She is currently monitored for the following issues for this high-risk pregnancy and has Bipolar 1 disorder ([place]); Supervision of other normal pregnancy, antepartum; AMA (advanced maternal age) multigravida 35+; Late prenatal care affecting pregnancy in second trimester; History of preterm delivery; History of VBAC; Increased SMA risk on Horizon; and Gestational diabetes on their problem list.  Patient reports heartburn.  Contractions: Irritability. Vag. Bleeding: None.  Movement: Present. Denies leaking of fluid.   The following portions of the patient's history were reviewed and updated as appropriate: allergies, current medications, past family history, past medical history, past social history, past surgical history and problem list.   Objective:   Vitals:   12/28/18 1327  BP: 109/77  Pulse: (!) 112  Weight: 142 lb 6.4 oz (64.6 kg)    Fetal Status: Fetal Heart Rate (bpm): 152 Fundal Height: 36 cm Movement: Present  Presentation: Vertex  General:  Alert, oriented and cooperative. Patient is in no acute distress.  Skin: Skin is warm and dry. No rash noted.   Cardiovascular: Normal heart rate noted  Respiratory: Normal respiratory effort, no problems with respiration noted  Abdomen: Soft, gravid, appropriate for gestational age.  Pain/Pressure: Present     Pelvic: Cervical exam deferred. Vertex position by [place]        Extremities: Normal range of motion.  Edema: None  Mental Status: Normal mood and affect. Normal behavior. Normal judgment and thought content.   Assessment and Plan:  Pregnancy: [id number] at [redacted]w[redacted]d   1. Supervision of other normal pregnancy, antepartum --Anticipatory guidance about next visits/weeks of pregnancy given. - Strep Gp B NAA - Cervicovaginal ancillary only( [place])  2. Heartburn during pregnancy in third  trimester --Discussed use of Pepcid BID PRN and dietary changes --Rx for Pepcid sent  Term labor symptoms and general obstetric precautions including but not limited to vaginal bleeding, contractions, leaking of fluid and fetal movement were reviewed in detail with the patient. Please refer to After Visit Summary for other counseling recommendations.   Return in about 2 weeks (around 01/11/2019).  Future Appointments  Date Time Provider [place]  01/04/2019  3:15 PM [place] 4 [facility]-MFCUS M[facility]-US  01/04/2019  3:30 PM [place] [facility]-US  01/11/2019  1:55 PM [place], Kathie Dike, CNM [facility] None    Fatima Blank, CNM

## 2018-12-28 NOTE — Progress Notes (Signed)
ROB/GBS.  C/o heartburns.

## 2018-12-29 ENCOUNTER — Ambulatory Visit (HOSPITAL_COMMUNITY): Payer: Medicaid Other

## 2018-12-29 ENCOUNTER — Encounter (HOSPITAL_COMMUNITY): Payer: Self-pay

## 2018-12-30 LAB — STREP GP B NAA: Strep Gp B NAA: NEGATIVE

## 2018-12-31 MED ORDER — FAMOTIDINE 20 MG PO TABS: 20.0000 mg | ORAL_TABLET | Freq: Two times a day (BID) | ORAL | 2 refills | Status: DC

## 2019-01-03 LAB — CERVICOVAGINAL ANCILLARY ONLY
Candida Glabrata: NEGATIVE
Candida Vaginitis: POSITIVE — AB
Chlamydia: NEGATIVE
Comment: NEGATIVE
Comment: NEGATIVE
Comment: NEGATIVE
Comment: NEGATIVE
Comment: NORMAL
Neisseria Gonorrhea: NEGATIVE
Trichomonas: NEGATIVE

## 2019-01-04 ENCOUNTER — Inpatient Hospital Stay (HOSPITAL_COMMUNITY)
Admission: AD | Admit: 2019-01-04 | Discharge: 2019-01-07 | DRG: 806 | Disposition: A | Discharge status: Home / Self Care | Payer: Medicaid Other | Attending: Family Medicine | Admitting: Family Medicine

## 2019-01-04 ENCOUNTER — Ambulatory Visit (HOSPITAL_COMMUNITY): Payer: Medicaid Other

## 2019-01-04 ENCOUNTER — Encounter (HOSPITAL_COMMUNITY): Payer: Self-pay

## 2019-01-04 DIAGNOSIS — O24420 Gestational diabetes mellitus in childbirth, diet controlled: Secondary | ICD-10-CM | POA: Diagnosis present

## 2019-01-04 DIAGNOSIS — Z3A37 37 weeks gestation of pregnancy: Secondary | ICD-10-CM | POA: Diagnosis not present

## 2019-01-04 DIAGNOSIS — Z20828 Contact with and (suspected) exposure to other viral communicable diseases: Secondary | ICD-10-CM | POA: Diagnosis present

## 2019-01-04 DIAGNOSIS — Z3480 Encounter for supervision of other normal pregnancy, unspecified trimester: Secondary | ICD-10-CM

## 2019-01-04 DIAGNOSIS — F319 Bipolar disorder, unspecified: Secondary | ICD-10-CM | POA: Diagnosis present

## 2019-01-04 DIAGNOSIS — O34219 Maternal care for unspecified type scar from previous cesarean delivery: Secondary | ICD-10-CM | POA: Diagnosis present

## 2019-01-04 DIAGNOSIS — D696 Thrombocytopenia, unspecified: Secondary | ICD-10-CM | POA: Diagnosis present

## 2019-01-04 DIAGNOSIS — F17210 Nicotine dependence, cigarettes, uncomplicated: Secondary | ICD-10-CM | POA: Diagnosis present

## 2019-01-04 DIAGNOSIS — O1414 Severe pre-eclampsia complicating childbirth: Secondary | ICD-10-CM | POA: Diagnosis present

## 2019-01-04 DIAGNOSIS — O1410 Severe pre-eclampsia, unspecified trimester: Secondary | ICD-10-CM

## 2019-01-04 DIAGNOSIS — O0932 Supervision of pregnancy with insufficient antenatal care, second trimester: Secondary | ICD-10-CM

## 2019-01-04 DIAGNOSIS — O9912 Other diseases of the blood and blood-forming organs and certain disorders involving the immune mechanism complicating childbirth: Secondary | ICD-10-CM | POA: Diagnosis present

## 2019-01-04 DIAGNOSIS — O09529 Supervision of elderly multigravida, unspecified trimester: Secondary | ICD-10-CM

## 2019-01-04 DIAGNOSIS — Z8751 Personal history of pre-term labor: Secondary | ICD-10-CM

## 2019-01-04 DIAGNOSIS — O99334 Smoking (tobacco) complicating childbirth: Secondary | ICD-10-CM | POA: Diagnosis present

## 2019-01-04 DIAGNOSIS — O479 False labor, unspecified: Secondary | ICD-10-CM | POA: Diagnosis present

## 2019-01-04 DIAGNOSIS — Z98891 History of uterine scar from previous surgery: Secondary | ICD-10-CM

## 2019-01-04 DIAGNOSIS — O24419 Gestational diabetes mellitus in pregnancy, unspecified control: Secondary | ICD-10-CM | POA: Diagnosis present

## 2019-01-04 DIAGNOSIS — O26893 Other specified pregnancy related conditions, third trimester: Secondary | ICD-10-CM | POA: Diagnosis present

## 2019-01-04 DIAGNOSIS — O99344 Other mental disorders complicating childbirth: Secondary | ICD-10-CM | POA: Diagnosis present

## 2019-01-04 DIAGNOSIS — O99119 Other diseases of the blood and blood-forming organs and certain disorders involving the immune mechanism complicating pregnancy, unspecified trimester: Secondary | ICD-10-CM

## 2019-01-04 DIAGNOSIS — G129 Spinal muscular atrophy, unspecified: Secondary | ICD-10-CM | POA: Diagnosis present

## 2019-01-04 MED ORDER — ACETAMINOPHEN 325 MG PO TABS: 650.0000 mg | ORAL_TABLET | ORAL | Status: DC | PRN

## 2019-01-04 MED ORDER — FENTANYL-BUPIVACAINE-NACL 0.5-0.125-0.9 MG/250ML-% EP SOLN: 12.0000 mL/h | EPIDURAL | Status: DC | PRN

## 2019-01-04 MED ORDER — DIPHENHYDRAMINE HCL 50 MG/ML IJ SOLN: 12.5000 mg | INTRAMUSCULAR | Status: DC | PRN

## 2019-01-04 MED ORDER — FENTANYL CITRATE (PF) 100 MCG/2ML IJ SOLN
100.0000 ug | INTRAMUSCULAR | Status: DC | PRN
  Administered 2019-01-04: 100 ug via INTRAVENOUS

## 2019-01-04 MED ORDER — OXYTOCIN 40 UNITS IN NORMAL SALINE INFUSION - SIMPLE MED
2.5000 [IU]/h | INTRAVENOUS | Status: DC
  Filled 2019-01-04: qty 1000

## 2019-01-04 MED ORDER — SOD CITRATE-CITRIC ACID 500-334 MG/5ML PO SOLN: 30.0000 mL | ORAL | Status: DC | PRN

## 2019-01-04 MED ORDER — LACTATED RINGERS IV SOLN: 500.0000 mL | INTRAVENOUS | Status: DC | PRN

## 2019-01-04 MED ORDER — LIDOCAINE HCL (PF) 1 % IJ SOLN: 30.0000 mL | INTRAMUSCULAR | Status: DC | PRN

## 2019-01-04 MED ORDER — OXYTOCIN BOLUS FROM INFUSION
500.0000 mL | Freq: Once | INTRAVENOUS | Status: AC
  Administered 2019-01-05: 500 mL via INTRAVENOUS

## 2019-01-04 MED ORDER — EPHEDRINE 5 MG/ML INJ: 10.0000 mg | INTRAVENOUS | Status: DC | PRN

## 2019-01-04 MED ORDER — PHENYLEPHRINE 40 MCG/ML (10ML) SYRINGE FOR IV PUSH (FOR BLOOD PRESSURE SUPPORT): 80.0000 ug | PREFILLED_SYRINGE | INTRAVENOUS | Status: DC | PRN

## 2019-01-04 MED ORDER — OXYCODONE-ACETAMINOPHEN 5-325 MG PO TABS: 1.0000 | ORAL_TABLET | ORAL | Status: DC | PRN

## 2019-01-04 MED ORDER — ONDANSETRON HCL 4 MG/2ML IJ SOLN: 4.0000 mg | Freq: Four times a day (QID) | INTRAMUSCULAR | Status: DC | PRN

## 2019-01-04 MED ORDER — LACTATED RINGERS IV SOLN
500.0000 mL | Freq: Once | INTRAVENOUS | Status: AC
  Administered 2019-01-05: 500 mL via INTRAVENOUS

## 2019-01-04 MED ORDER — OXYCODONE-ACETAMINOPHEN 5-325 MG PO TABS: 2.0000 | ORAL_TABLET | ORAL | Status: DC | PRN

## 2019-01-04 MED ORDER — FENTANYL CITRATE (PF) 100 MCG/2ML IJ SOLN
INTRAMUSCULAR | Status: AC
  Administered 2019-01-04: 100 ug via INTRAVENOUS
  Filled 2019-01-04: qty 2

## 2019-01-04 MED ORDER — LACTATED RINGERS IV SOLN: INTRAVENOUS | Status: DC

## 2019-01-05 ENCOUNTER — Other Ambulatory Visit: Payer: Self-pay

## 2019-01-05 ENCOUNTER — Encounter (HOSPITAL_COMMUNITY): Payer: Self-pay

## 2019-01-05 DIAGNOSIS — Z3A37 37 weeks gestation of pregnancy: Secondary | ICD-10-CM

## 2019-01-05 DIAGNOSIS — O24420 Gestational diabetes mellitus in childbirth, diet controlled: Secondary | ICD-10-CM

## 2019-01-05 DIAGNOSIS — O34219 Maternal care for unspecified type scar from previous cesarean delivery: Secondary | ICD-10-CM

## 2019-01-05 LAB — ABO/RH: ABO/RH(D): O POS

## 2019-01-05 LAB — CBC WITH DIFFERENTIAL/PLATELET
Abs Immature Granulocytes: 0.21 10*3/uL — ABNORMAL HIGH (ref 0.00–0.07)
Basophils Absolute: 0.1 10*3/uL (ref 0.0–0.1)
Basophils Relative: 0 %
Eosinophils Absolute: 0.1 10*3/uL (ref 0.0–0.5)
Eosinophils Relative: 1 %
HCT: 36.4 % (ref 36.0–46.0)
Hemoglobin: 12.2 g/dL (ref 12.0–15.0)
Immature Granulocytes: 1 %
Lymphocytes Relative: 21 %
Lymphs Abs: 3.1 10*3/uL (ref 0.7–4.0)
MCH: 32.1 pg (ref 26.0–34.0)
MCHC: 33.5 g/dL (ref 30.0–36.0)
MCV: 95.8 fL (ref 80.0–100.0)
Monocytes Absolute: 1 10*3/uL (ref 0.1–1.0)
Monocytes Relative: 7 %
Neutro Abs: 10.1 10*3/uL — ABNORMAL HIGH (ref 1.7–7.7)
Neutrophils Relative %: 70 %
Platelets: 79 10*3/uL — ABNORMAL LOW (ref 150–400)
RBC: 3.8 MIL/uL — ABNORMAL LOW (ref 3.87–5.11)
RDW: 13.3 % (ref 11.5–15.5)
WBC: 14.5 10*3/uL — ABNORMAL HIGH (ref 4.0–10.5)
nRBC: 0.1 % (ref 0.0–0.2)

## 2019-01-05 LAB — RPR: RPR Ser Ql: NONREACTIVE

## 2019-01-05 LAB — CBC
HCT: 37.3 % (ref 36.0–46.0)
Hemoglobin: 13 g/dL (ref 12.0–15.0)
MCH: 32.7 pg (ref 26.0–34.0)
MCHC: 34.9 g/dL (ref 30.0–36.0)
MCV: 94 fL (ref 80.0–100.0)
Platelets: 88 10*3/uL — ABNORMAL LOW (ref 150–400)
RBC: 3.97 MIL/uL (ref 3.87–5.11)
RDW: 13.2 % (ref 11.5–15.5)
WBC: 11.7 10*3/uL — ABNORMAL HIGH (ref 4.0–10.5)
nRBC: 0.3 % — ABNORMAL HIGH (ref 0.0–0.2)

## 2019-01-05 LAB — TYPE AND SCREEN
ABO/RH(D): O POS
Antibody Screen: NEGATIVE

## 2019-01-05 LAB — SARS CORONAVIRUS 2 BY RT PCR (HOSPITAL ORDER, PERFORMED IN ~~LOC~~ HOSPITAL LAB): SARS Coronavirus 2: NEGATIVE

## 2019-01-05 MED ORDER — WITCH HAZEL-GLYCERIN EX PADS: 1.0000 "application " | MEDICATED_PAD | CUTANEOUS | Status: DC | PRN

## 2019-01-05 MED ORDER — OXYCODONE HCL 5 MG PO TABS: 10.0000 mg | ORAL_TABLET | ORAL | Status: DC | PRN

## 2019-01-05 MED ORDER — BENZOCAINE-MENTHOL 20-0.5 % EX AERO
1.0000 "application " | INHALATION_SPRAY | CUTANEOUS | Status: DC | PRN
  Administered 2019-01-07: 1 via TOPICAL
  Filled 2019-01-05: qty 56

## 2019-01-05 MED ORDER — SENNOSIDES-DOCUSATE SODIUM 8.6-50 MG PO TABS
2.0000 | ORAL_TABLET | ORAL | Status: DC
  Filled 2019-01-05 (×2): qty 2

## 2019-01-05 MED ORDER — IBUPROFEN 600 MG PO TABS
600.0000 mg | ORAL_TABLET | Freq: Four times a day (QID) | ORAL | Status: DC
  Administered 2019-01-05 (×2): 600 mg via ORAL
  Filled 2019-01-05 (×2): qty 1

## 2019-01-05 MED ORDER — ASPIRIN EC 81 MG PO TBEC
81.0000 mg | DELAYED_RELEASE_TABLET | Freq: Every day | ORAL | Status: DC
  Administered 2019-01-05 – 2019-01-07 (×3): 81 mg via ORAL
  Filled 2019-01-05 (×3): qty 1

## 2019-01-05 MED ORDER — MAGNESIUM HYDROXIDE 400 MG/5ML PO SUSP: 30.0000 mL | ORAL | Status: DC | PRN

## 2019-01-05 MED ORDER — FENTANYL CITRATE (PF) 100 MCG/2ML IJ SOLN
50.0000 ug | Freq: Once | INTRAMUSCULAR | Status: AC
  Administered 2019-01-05: 50 ug via INTRAVENOUS

## 2019-01-05 MED ORDER — ONDANSETRON HCL 4 MG PO TABS: 4.0000 mg | ORAL_TABLET | ORAL | Status: DC | PRN

## 2019-01-05 MED ORDER — DIBUCAINE (PERIANAL) 1 % EX OINT: 1.0000 "application " | TOPICAL_OINTMENT | CUTANEOUS | Status: DC | PRN

## 2019-01-05 MED ORDER — DIPHENHYDRAMINE HCL 25 MG PO CAPS: 25.0000 mg | ORAL_CAPSULE | Freq: Four times a day (QID) | ORAL | Status: DC | PRN

## 2019-01-05 MED ORDER — ONDANSETRON HCL 4 MG/2ML IJ SOLN: 4.0000 mg | INTRAMUSCULAR | Status: DC | PRN

## 2019-01-05 MED ORDER — PRENATAL MULTIVITAMIN CH
1.0000 | ORAL_TABLET | Freq: Every day | ORAL | Status: DC
  Administered 2019-01-05: 11:00:00 1 via ORAL
  Filled 2019-01-05: qty 1

## 2019-01-05 MED ORDER — MEDROXYPROGESTERONE ACETATE 150 MG/ML IM SUSP
150.0000 mg | Freq: Once | INTRAMUSCULAR | Status: AC
  Administered 2019-01-06: 150 mg via INTRAMUSCULAR
  Filled 2019-01-05: qty 1

## 2019-01-05 MED ORDER — COCONUT OIL OIL: 1.0000 "application " | TOPICAL_OIL | Status: DC | PRN

## 2019-01-05 MED ORDER — ACETAMINOPHEN 325 MG PO TABS
650.0000 mg | ORAL_TABLET | ORAL | Status: DC | PRN
  Administered 2019-01-05: 650 mg via ORAL
  Filled 2019-01-05 (×2): qty 2

## 2019-01-05 MED ORDER — SIMETHICONE 80 MG PO CHEW: 80.0000 mg | CHEWABLE_TABLET | ORAL | Status: DC | PRN

## 2019-01-05 MED ORDER — OXYCODONE HCL 5 MG PO TABS: 5.0000 mg | ORAL_TABLET | ORAL | Status: DC | PRN

## 2019-01-05 MED ORDER — FENTANYL CITRATE (PF) 100 MCG/2ML IJ SOLN
INTRAMUSCULAR | Status: AC
  Filled 2019-01-05: qty 2

## 2019-01-05 NOTE — MAU Note (Signed)
Pt arrived EMS. Pt in active labor feeling urge to push. Hansel Feinstein, CNM at bedside pt is 6/80/0.   Admission orders obtained.   Labor and delivery charge nurse notified.   Expedited patient transfer to l&d

## 2019-01-05 NOTE — H&P (Addendum)
LABOR AND DELIVERY ADMISSION HISTORY AND PHYSICAL NOTE  Amber [name] is a 38 y.o. female [phone] with IUP at 48w1dby 26wk [place] c/w LMP presenting for spontaneous labor.   Patient arrived to the floor fully dilated and precipitously delivered a viable female infant.  She plans on bottle feeding. She requests Depo for birth control.  Prenatal History/Complications: PNC at [facility]  Pregnancy complications:  - AMA - Bipolar I disorder - Hx of VBAC x2>3 - GDMA1  Past Medical History: Past Medical History:  Diagnosis Date  . Anemia   . Anxiety   . Bipolar 1 disorder ([place]   . Depression   . Gestational diabetes 12/02/2018  . Headache   . Pregnancy induced hypertension     Past Surgical History: Past Surgical History:  Procedure Laterality Date  . CESAREAN SECTION      Obstetrical History: OB History    Gravida  5   Para  4   Term  3   Preterm  1   AB  1   Living  4     SAB  0   TAB  0   Ectopic  0   Multiple  0   Live Births  3           Social History: Social History   Socioeconomic History  . Marital status: Single    Spouse name: Not on file  . Number of children: Not on file  . Years of education: Not on file  . Highest education level: Not on file  Occupational History  . Not on file  Social Needs  . Financial resource strain: Not on file  . Food insecurity    Worry: Not on file    Inability: Not on file  . Transportation needs    Medical: Not on file    Non-medical: Not on file  Tobacco Use  . Smoking status: Current Every Day Smoker    Packs/day: 0.50    Types: Cigarettes  . Smokeless tobacco: Never Used  Substance and Sexual Activity  . Alcohol use: No  . Drug use: No  . Sexual activity: Yes    Birth control/protection: None  Lifestyle  . Physical activity    Days per week: Not on file    Minutes per session: Not on file  . Stress: Not on file  Relationships  . Social [place] phone: Not on file     Gets together: Not on file    Attends religious service: Not on file    Active member of club or organization: Not on file    Attends meetings of clubs or organizations: Not on file    Relationship status: Not on file  Other Topics Concern  . Not on file  Social History Narrative  . Not on file    Family History: Family History  Problem Relation Age of Onset  . Arthritis Mother   . Hypertension Mother   . Alcohol abuse Neg Hx   . Asthma Neg Hx   . Birth defects Neg Hx   . Cancer Neg Hx   . COPD Neg Hx   . Depression Neg Hx   . Diabetes Neg Hx   . Drug abuse Neg Hx   . Early death Neg Hx   . Heart disease Neg Hx   . Hearing loss Neg Hx   . Hyperlipidemia Neg Hx   . Kidney disease Neg Hx   . Learning disabilities  Neg Hx   . Mental illness Neg Hx   . Mental retardation Neg Hx   . Miscarriages / Stillbirths Neg Hx   . Stroke Neg Hx   . Vision loss Neg Hx   . Varicose Veins Neg Hx     Allergies: No Known Allergies  Medications Prior to Admission  Medication Sig Dispense Refill Last Dose  . Accu-Chek FastClix Lancets MISC 1 Units by Percutaneous route 4 (four) times daily. 100 each 12   . aspirin EC 81 MG tablet Take 1 tablet (81 mg total) by mouth daily. Take after 12 weeks for prevention of preeclampsia later in pregnancy (Patient not taking: Reported on 10/20/2018) 300 tablet 2   . Blood Glucose Monitoring Suppl (ACCU-CHEK GUIDE) w/Device KIT 1 Device by Does not apply route 4 (four) times daily. 1 kit 0   . Blood Pressure Monitor KIT 1 kit by Does not apply route once a week. Check BP weekly,  Regular Cuff.  DX:   O09.90          Z13.6 (Patient not taking: Reported on 11/08/2018) 1 kit 0   . Blood Pressure Monitoring (BLOOD PRESSURE KIT) DEVI 1 Device by Does not apply route as needed (Monitor BP at home regularly z34.90 standard size). (Patient not taking: Reported on 11/24/2018) 1 Device 0   . cefadroxil (DURICEF) 500 MG capsule Take 1 capsule (500 mg total) by mouth 2  (two) times daily. (Patient not taking: Reported on 11/08/2018) 14 capsule 0   . Elastic Bandages & Supports (COMFORT FIT MATERNITY SUPP SM) MISC Wear as directed. 1 each 0   . famotidine (PEPCID) 20 MG tablet Take 1 tablet (20 mg total) by mouth 2 (two) times daily. 60 tablet 2   . glucose blood (ACCU-CHEK GUIDE) test strip Use to check blood sugars four times a day was instructed 50 each 12   . metroNIDAZOLE (FLAGYL) 500 MG tablet Take 1 tablet (500 mg total) by mouth 2 (two) times daily. (Patient not taking: Reported on 11/08/2018) 14 tablet 0   . miconazole (MONISTAT 7) 2 % vaginal cream Place 1 Applicatorful vaginally at bedtime. Apply for seven nights (Patient not taking: Reported on 11/08/2018) 30 g 2   . phenylephrine-shark liver oil-mineral oil-petrolatum (PREPARATION H) 0.25-3-14-71.9 % rectal ointment Place 1 application rectally 2 (two) times daily as needed. Takes for hemorrhoid pain     . Prenatal Vit-Fe Fumarate-FA (PRENATAL MULTIVITAMIN) TABS Take 1 tablet by mouth daily.     Marland Kitchen sulfamethoxazole-trimethoprim (BACTRIM DS) 800-160 MG tablet Take 1 tablet by mouth 2 (two) times daily. (Patient not taking: Reported on 11/08/2018) 14 tablet 1      Review of Systems  All systems reviewed and negative except as stated in HPI  Physical Exam Blood pressure (!) 142/86, pulse 86, temperature 98.7 F (37.1 C), temperature source Oral, resp. rate 17, last menstrual period 03/31/2018, SpO2 100 %, unknown if currently breastfeeding. General appearance: alert, oriented, NAD Lungs: normal respiratory effort Heart: regular rate Abdomen: soft, non-tender; gravid Extremities: No calf swelling or tenderness Presentation: cephalic  Dilation: 10 Effacement (%): 100 Station: 0 Exam by:: Lexie RN   Prenatal labs: ABO, Rh: --/--/O POS, O POS Performed at [facility], [place] [address] [place], [place] [address]  [phone]/20 2352) Antibody: NEG (10/20 2352) Rubella: 1.30 (07/27 1433) RPR: Non  Reactive (08/26 0956)  HBsAg: Negative (07/27 1433)  HIV: Non Reactive (08/26 0956)  GC/Chlamydia: neg/neg 12/28/2018  GBS: --[place] (10/13 0147)  2-hr GTT: abnormal 11/10/2018 Genetic screening:  Low risk NIPS Anatomy US: normal  Prenatal Transfer Tool  Maternal Diabetes: Yes:  Diabetes Type:  Diet controlled Genetic Screening: Normal Maternal Ultrasounds/Referrals: Normal Fetal Ultrasounds or other Referrals:  None Maternal Substance Abuse:  No Significant Maternal Medications:  None Significant Maternal Lab Results: Group B Strep negative  Results for orders placed or performed during the hospital encounter of 01/04/19 (from the past 24 hour(s))  SARS Coronavirus 2 by RT PCR (hospital order, performed in [facility] hospital lab) Nasopharyngeal Nasopharyngeal Swab   Collection Time: 01/04/19 11:45 PM   Specimen: Nasopharyngeal Swab  Result Value Ref Range   SARS Coronavirus 2 NEGATIVE NEGATIVE  CBC   Collection Time: 01/04/19 11:52 PM  Result Value Ref Range   WBC 11.7 (H) 4.0 - 10.5 K/uL   RBC 3.97 3.87 - 5.11 MIL/uL   Hemoglobin 13.0 12.0 - 15.0 g/dL   HCT 37.3 36.0 - 46.0 %   MCV 94.0 80.0 - 100.0 fL   MCH 32.7 26.0 - 34.0 pg   MCHC 34.9 30.0 - 36.0 g/dL   RDW 13.2 11.5 - 15.5 %   Platelets 88 (L) 150 - 400 K/uL   nRBC 0.3 (H) 0.0 - 0.2 %  Type and screen [place]   Collection Time: 01/04/19 11:52 PM  Result Value Ref Range   ABO/RH(D) O POS    Antibody Screen NEG    Sample Expiration      01/07/2019,2359 Performed at [facility], [place] [address] [place], [place] [address]   ABO/Rh   Collection Time: 01/04/19 11:52 PM  Result Value Ref Range   ABO/RH(D)      O POS Performed at [facility] [address], [place], [place] [address]     Patient Active Problem List   Diagnosis Date Noted  . Uterine contractions during pregnancy 01/04/2019  . Gestational diabetes 12/02/2018  . Increased SMA risk on [place] 10/25/2018   . AMA (advanced maternal age) multigravida 35+ 10/11/2018  . Late prenatal care affecting pregnancy in second trimester 10/11/2018  . History of preterm delivery 10/11/2018  . History of VBAC 10/11/2018  . Supervision of other normal pregnancy, antepartum 09/27/2018  . Bipolar 1 disorder [facility])     Assessment: Amber [name] is a 38 y.o. [id number] at 106w1dhere for spontaneous labor and s/p precipitous uncomplicated NSVD.   #Labor: Arrived fully dilated, had SROM and subsequently uncomplicated NSVD. #Pain: Given fentanyl 1014m just prior to delivery and 5034mafter.  #FWB: Doing well, couplet care.  #GBS/ID:  Negative #COVID: swab negative #MOF: Bottle #MOC: Depo #Circ: Yes, offer inpatient #GDMA1: AM CBG tomorrow #Elevated BP's: two elevated BP's in setting of unmedicated birth, ctm.   MatAnnice NeedykMargaret R. [facility]/21/2020, 2:46 AM

## 2019-01-05 NOTE — Discharge Summary (Signed)
Postpartum Discharge Summary    Patient Name: Amber [name] DOB: 1981/01/25 MRN: [id number]  Date of admission: 01/04/2019 Delivering Provider: Clarnce Flock   Date of discharge: 01/06/2019  Admitting diagnosis: CTX Intrauterine pregnancy: [redacted]w[redacted]d    Secondary diagnosis:  Active Problems:   Bipolar 1 disorder ([place]   Supervision of other normal pregnancy, antepartum   AMA (advanced maternal age) multigravida 35+   Late prenatal care affecting pregnancy in second trimester   History of preterm delivery   History of VBAC   Increased SMA risk on [place]   Gestational diabetes   Uterine contractions during pregnancy   Thrombocytopenia affecting pregnancy ([place]   Severe preeclampsia  Additional problems:      Discharge diagnosis: Term Pregnancy Delivered, VBAC and GDM A1                                                                                                Post partum procedures:None  Augmentation: None  Complications: None  Hospital course:  Onset of Labor With Vaginal Delivery     38y.o. yo [id number] 361w1das admitted in Active Labor on 01/04/2019. Patient had an uncomplicated labor course as follows: Arrived to MAU 6cm, was 10cm on arrival to floor and had precipitous delivery. Membrane Rupture Time/Date: 12:12 AM ,01/05/2019   Intrapartum Procedures: Episiotomy: None [1]                                         Lacerations:  Periurethral [8]  Patient had a delivery of a Viable infant. 01/05/2019  Information for the patient's newborn:  OwYesly, Gerety0[[id number]]Delivery Method: VBAC, Spontaneous(Filed from Delivery Summary)     Pateint had an PP course notable for PP diagnosis of Pre-eclampsia with severe features, diagnosed by mild range blood pressures and platelets less than 100k. It was recommended she stay another day for BP monitoring due to this new diagnosis but patient preferred to be discharged due to child care issues. Counseled on warning  signs of pre-eclampsia, had BP check scheduled within 1 week.  At time of discharge she was ambulating, tolerating a regular diet, passing flatus, and urinating well. Patient is discharged home in stable condition on 01/06/19.  Delivery time: 12:21 AM    Magnesium Sulfate received: No BMZ received: No Rhophylac:N/A MMR:N/A Transfusion:No  Physical exam  Vitals:   01/05/19 1358 01/05/19 1929 01/05/19 2334 01/06/19 0547  BP: 132/87 (!) 128/91 116/81 119/76  Pulse: 74 71 84 87  Resp: 18 17 16 15   Temp: 98.6 F (37 C) 97.8 F (36.6 C) 98.2 F (36.8 C) 98 F (36.7 C)  TempSrc: Oral Oral Oral Oral  SpO2:  100%  100%   General: alert, cooperative and no distress Lochia: appropriate Uterine Fundus: firm Incision: N/A DVT Evaluation: No evidence of DVT seen on physical exam. No significant calf/ankle edema. Labs: Lab Results  Component Value Date   WBC 14.5 (H) 01/05/2019   HGB 12.2 01/05/2019   HCT  36.4 01/05/2019   MCV 95.8 01/05/2019   PLT 79 (L) 01/05/2019   CMP Latest Ref Rng & Units 01/06/2019  Glucose 70 - 99 mg/dL 76  BUN 6 - 20 mg/dL 9  Creatinine 0.44 - 1.00 mg/dL 0.93  Sodium 135 - 145 mmol/L 135  Potassium 3.5 - 5.1 mmol/L 4.1  Chloride 98 - 111 mmol/L 106  CO2 22 - 32 mmol/L 22  Calcium 8.9 - 10.3 mg/dL 8.7(L)  Total Protein 6.5 - 8.1 g/dL 5.1(L)  Total Bilirubin 0.3 - 1.2 mg/dL 0.3  Alkaline Phos 38 - 126 U/L 231(H)  AST 15 - 41 U/L 26  ALT 0 - 44 U/L 14    Discharge instruction: per After Visit Summary and "Baby and Me Booklet".  After visit meds:  Allergies as of 01/06/2019   No Known Allergies     Medication List    STOP taking these medications   Accu-Chek FastClix Lancets Misc   Accu-Chek Guide test strip Generic drug: glucose blood   Accu-Chek Guide w/Device Kit   cefadroxil 500 MG capsule Commonly known as: [place] Supp Sm Misc   metroNIDAZOLE 500 MG tablet Commonly known as: FLAGYL   miconazole 2 %  vaginal cream Commonly known as: MONISTAT 7   phenylephrine-shark liver oil-mineral oil-petrolatum 0.25-3-14-71.9 % rectal ointment Commonly known as: PREPARATION H   sulfamethoxazole-trimethoprim 800-160 MG tablet Commonly known as: BACTRIM DS     TAKE these medications   acetaminophen 325 MG tablet Commonly known as: Tylenol Take 2 tablets (650 mg total) by mouth every 4 (four) hours as needed (for pain scale < 4).   aspirin EC 81 MG tablet Take 1 tablet (81 mg total) by mouth daily. Take after 12 weeks for prevention of preeclampsia later in pregnancy   Blood Pressure Monitor Kit 1 kit by Does not apply route once a week. Check BP weekly,  Regular Cuff.  DX:   O09.90          Z13.6   Blood Pressure Kit Devi 1 Device by Does not apply route as needed (Monitor BP at home regularly z34.90 standard size).   famotidine 20 MG tablet Commonly known as: Pepcid Take 1 tablet (20 mg total) by mouth 2 (two) times daily.   ibuprofen 800 MG tablet Commonly known as: ADVIL Take 1 tablet (800 mg total) by mouth every 8 (eight) hours as needed.   polyethylene glycol powder 17 GM/SCOOP powder Commonly known as: GLYCOLAX/MIRALAX Take 255 g by mouth once for 1 dose.   prenatal multivitamin Tabs tablet Take 1 tablet by mouth daily.       Diet: routine diet  Activity: Advance as tolerated. Pelvic rest for 6 weeks.   Outpatient follow up:6 weeks Follow up Appt: Future Appointments  Date Time Provider [place]  01/11/2019 11:30 AM [facility] None  02/03/2019  1:00 PM Sloan Leiter, MD [place] None  02/16/2019  8:30 AM [facility] None   Follow up Visit:   Please schedule this patient for Postpartum visit in: 6 weeks with the following provider: Any provider For C/S patients schedule nurse incision check in weeks 2 weeks: no High risk pregnancy complicated by: GDM Delivery mode:  SVD Anticipated Birth Control:  Depo PP Procedures needed: BP  check, 2hr GTT  Schedule Integrated BH visit: no   Newborn Data: Live born female  Birth Weight: 6 lb 2.4 oz (2790 g) APGAR: 8, 9  Newborn Delivery  Birth date/time: 01/05/2019 00:21:00 Delivery type: VBAC, Spontaneous      Baby Feeding: Bottle Disposition:home with mother   01/06/2019 Clarnce Flock, MD

## 2019-01-06 DIAGNOSIS — O1410 Severe pre-eclampsia, unspecified trimester: Secondary | ICD-10-CM

## 2019-01-06 DIAGNOSIS — D696 Thrombocytopenia, unspecified: Secondary | ICD-10-CM

## 2019-01-06 LAB — COMPREHENSIVE METABOLIC PANEL
ALT: 14 U/L (ref 0–44)
AST: 26 U/L (ref 15–41)
Albumin: 2.5 g/dL — ABNORMAL LOW (ref 3.5–5.0)
Alkaline Phosphatase: 231 U/L — ABNORMAL HIGH (ref 38–126)
Anion gap: 7 (ref 5–15)
BUN: 9 mg/dL (ref 6–20)
CO2: 22 mmol/L (ref 22–32)
Calcium: 8.7 mg/dL — ABNORMAL LOW (ref 8.9–10.3)
Chloride: 106 mmol/L (ref 98–111)
Creatinine, Ser: 0.93 mg/dL (ref 0.44–1.00)
GFR calc Af Amer: >60 mL/min (ref >60)
GFR calc non Af Amer: >60 mL/min (ref >60)
Glucose, Bld: 76 mg/dL (ref 70–99)
Potassium: 4.1 mmol/L (ref 3.5–5.1)
Sodium: 135 mmol/L (ref 135–145)
Total Bilirubin: 0.3 mg/dL (ref 0.3–1.2)
Total Protein: 5.1 g/dL — ABNORMAL LOW (ref 6.5–8.1)

## 2019-01-06 LAB — GLUCOSE, CAPILLARY: Glucose-Capillary: 108 mg/dL — ABNORMAL HIGH (ref 70–99)

## 2019-01-06 MED ORDER — IBUPROFEN 800 MG PO TABS: 800.0000 mg | ORAL_TABLET | Freq: Three times a day (TID) | ORAL | 0 refills | Status: DC | PRN

## 2019-01-06 MED ORDER — POLYETHYLENE GLYCOL 3350 17 GM/SCOOP PO POWD: 1.0000 | Freq: Once | ORAL | 0 refills | Status: AC

## 2019-01-06 MED ORDER — ACETAMINOPHEN 325 MG PO TABS: 650.0000 mg | ORAL_TABLET | ORAL | 0 refills | Status: DC | PRN

## 2019-01-06 NOTE — Progress Notes (Signed)
Returned to speak with patient again about new diagnosis of Pre-E w severe features by mild range BP's and thrombocytopenia. Stressed importance of additional day of monitoring and recommendation that leaving would be AMA. After discussion patient agreed to stay additional day. Monitor BP's and recheck CBC in AM.

## 2019-01-06 NOTE — Progress Notes (Signed)
CLINICAL SOCIAL WORK MATERNAL/CHILD NOTE  Patient Details  Name: Amber [name] MRN: [id number] Date of Birth: 12-25-2017  Date:  2018/10/20  Clinical Social Worker Initiating Note:  Elijio Miles Date/Time: Initiated:  01/06/19/0902     Child's Name:  Amber [name]   Biological Parents:  Mother, Father(Marchella Ricard Dillon and Philipe Heitman Shark)   Need for Interpreter:  None   Reason for Referral:  Behavioral Health Concerns   Address:  [place] [address]    Phone number:  [phone] (home)     Additional phone number:   Household Members/Support Persons (HM/SP):   Household Member/Support Person 1, Household Member/Support Person 2, Household Member/Support Person 3   HM/SP Name Relationship DOB or Age  HM/SP -1 Stephen Baruch Daughter 10/23/2011  HM/SP -2 Kyriakos Babler Son 09/10/2009  HM/SP -3 Miracle Ricard Dillon Daughter 07/27/2013  HM/SP -4        HM/SP -5        HM/SP -6        HM/SP -7        HM/SP -8          Natural Supports (not living in the home):  Parent, Friends   Professional Supports: None   Employment: Full-time   Type of Work: [employer]   Education:  9 to 11 years   Homebound arranged:    Museum/gallery curator Resources:  Medicaid   Other Resources:  Physicist, medical , [place] Considerations Which May Impact Care:    Strengths:  Ability to meet basic needs , Home prepared for child , Pediatrician chosen   Psychotropic Medications:         Pediatrician:    [place] area  Pediatrician List:   [facility]      Pediatrician Fax Number:    Risk Factors/Current Problems:      Cognitive State:  Alert , Able to Concentrate , Linear Thinking    Mood/Affect:  Calm , Interested , Sad , Tearful    CSW Assessment:  CSW received consult for history of anxiety, depression, bipolar  disorder and score of 12 on Edinburgh Depression Screen.  CSW met with MOB to offer support and complete assessment.    MOB sitting up in bed bonding with infant, when CSW entered the room. CSW introduced self and explained reason for consult to which MOB was understanding. MOB became tearful and CSW inquired about what MOB was feeling. MOB shared with CSW that she had just gotten off the phone with her daughter and reported that she wanted to be home with her children. CSW validated and normalized MOB's feelings. MOB stated she has three children not including infant that live in the home with her and that they are all excited for infant and MOB to be home. MOB stated she currently works full-time for [employer]. MOB confirmed she receives both [facility] and food stamps and is aware of process to get infant added on to her plans. CSW inquired about MOB's mental health history and MOB acknowledged being diagnosed with anxiety, depression and bipolar disorder about 9 years ago. MOB denied any current medications or counseling and denied any recent symptoms or concerns. CSW inquired about MOB's score of 12 on Edinburgh and MOB reported she had a bad day yesterday and attributed that to her score. MOB denied any history of PPD  but was receptive to education. CSW provided education regarding the baby blues period vs. perinatal mood disorders, discussed treatment and gave resources for mental health follow up if concerns arise.  CSW recommends self-evaluation during the postpartum time period using the New Mom Checklist from Postpartum Progress and encouraged MOB to contact a medical professional if symptoms are noted at any time. MOB did not appear to be displaying any acute mental health symptoms and denied any current SI, HI or DV. MOB reported having good support from FOB, her mom, her dad and her friends.   MOB confirmed having all essential items for infant once discharged and reported infant would be sleeping in a  bassinet once home. CSW provided review of Sudden Infant Death Syndrome (SIDS) precautions and safe sleeping habits.     CSW Plan/Description:  No Further Intervention Required/No Barriers to Discharge, Sudden Infant Death Syndrome (SIDS) Education, Perinatal Mood and Anxiety Disorder (PMADs) Education    [place], [place] 01/06/2019, 3:17 PM

## 2019-01-06 NOTE — Discharge Instructions (Signed)
Postpartum Hypertension  Postpartum hypertension is high blood pressure that remains higher than normal after childbirth. You may not realize that you have postpartum hypertension if your blood pressure is not being checked regularly. In most cases, postpartum hypertension will go away on its own, usually within a week of delivery. However, for some women, medical treatment is required to prevent serious complications, such as seizures or stroke.  What are the causes?  This condition may be caused by one or more of the following:  · Hypertension that existed before pregnancy (chronic hypertension).  · Hypertension that comes on as a result of pregnancy (gestational hypertension).  · Hypertensive disorders during pregnancy (preeclampsia) or seizures in women who have high blood pressure during pregnancy (eclampsia).  · A condition in which the liver, platelets, and red blood cells are damaged during pregnancy (HELLP syndrome).  · A condition in which the thyroid produces too much hormones (hyperthyroidism).  · Other rare problems of the nerves (neurological disorders) or blood disorders.  In some cases, the cause may not be known.  What increases the risk?  The following factors may make you more likely to develop this condition:  · Chronic hypertension. In some cases, this may not have been diagnosed before pregnancy.  · Obesity.  · Type 2 diabetes.  · Kidney disease.  · History of preeclampsia or eclampsia.  · Other medical conditions that change the level of hormones in the body (hormonal imbalance).  What are the signs or symptoms?  As with all types of hypertension, postpartum hypertension may not have any symptoms. Depending on how high your blood pressure is, you may experience:  · Headaches. These may be mild, moderate, or severe. They may also be steady, constant, or sudden in onset (thunderclap headache).  · Changes in your ability to see (visual changes).  · Dizziness.  · Shortness of breath.  · Swelling  of your hands, feet, lower legs, or face. In some cases, you may have swelling in more than one of these locations.  · Heart palpitations or a racing heartbeat.  · Difficulty breathing while lying down.  · Decrease in the amount of urine that you pass.  Other rare signs and symptoms may include:  · Sweating more than usual. This lasts longer than a few days after delivery.  · Chest pain.  · Sudden dizziness when you get up from sitting or lying down.  · Seizures.  · Nausea or vomiting.  · Abdominal pain.  How is this diagnosed?  This condition may be diagnosed based on the results of a physical exam, blood pressure measurements, and blood and urine tests.  You may also have other tests, such as a CT scan or an MRI, to check for other problems of postpartum hypertension.  How is this treated?  If blood pressure is high enough to require treatment, your options may include:  · Medicines to reduce blood pressure (antihypertensives). Tell your health care provider if you are breastfeeding or if you plan to breastfeed. There are many antihypertensive medicines that are safe to take while breastfeeding.  · Stopping medicines that may be causing hypertension.  · Treating medical conditions that are causing hypertension.  · Treating the complications of hypertension, such as seizures, stroke, or kidney problems.  Your health care provider will also continue to monitor your blood pressure closely until it is within a safe range for you.  Follow these instructions at home:  · Take over-the-counter and prescription medicines only as   told by your health care provider.  · Return to your normal activities as told by your health care provider. Ask your health care provider what activities are safe for you.  · Do not use any products that contain nicotine or tobacco, such as cigarettes and e-cigarettes. If you need help quitting, ask your health care provider.  · Keep all follow-up visits as told by your health care provider. This  is important.  Contact a health care provider if:  · Your symptoms get worse.  · You have new symptoms, such as:  ? A headache that does not get better.  ? Dizziness.  ? Visual changes.  Get help right away if:  · You suddenly develop swelling in your hands, ankles, or face.  · You have sudden, rapid weight gain.  · You develop difficulty breathing, chest pain, racing heartbeat, or heart palpitations.  · You develop severe pain in your abdomen.  · You have any symptoms of a stroke. "BE FAST" is an easy way to remember the main warning signs of a stroke:  ? B - Balance. Signs are dizziness, sudden trouble walking, or loss of balance.  ? E - Eyes. Signs are trouble seeing or a sudden change in vision.  ? F - Face. Signs are sudden weakness or numbness of the face, or the face or eyelid drooping on one side.  ? A - Arms. Signs are weakness or numbness in an arm. This happens suddenly and usually on one side of the body.  ? S - Speech. Signs are sudden trouble speaking, slurred speech, or trouble understanding what people say.  ? T - Time. Time to call emergency services. Write down what time symptoms started.  · You have other signs of a stroke, such as:  ? A sudden, severe headache with no known cause.  ? Nausea or vomiting.  ? Seizure.  These symptoms may represent a serious problem that is an emergency. Do not wait to see if the symptoms will go away. Get medical help right away. Call your local emergency services (911 in the [place] Do not drive yourself to the hospital.  Summary  · Postpartum hypertension is high blood pressure that remains higher than normal after childbirth.  · In most cases, postpartum hypertension will go away on its own, usually within a week of delivery.  · For some women, medical treatment is required to prevent serious complications, such as seizures or stroke.  This information is not intended to replace advice given to you by your health care provider. Make sure you discuss any questions  you have with your health care provider.  Document Released: 11/04/2013 Document Revised: 04/09/2018 Document Reviewed: 12/22/2016  Elsevier Patient Education © 2020 [employer]    Postpartum Care After Vaginal Delivery  This sheet gives you information about how to care for yourself from the time you deliver your baby to up to 6-12 weeks after delivery (postpartum period). Your health care provider may also give you more specific instructions. If you have problems or questions, contact your health care provider.  Follow these instructions at home:  Vaginal bleeding  · It is normal to have vaginal bleeding (lochia) after delivery. Wear a sanitary pad for vaginal bleeding and discharge.  ? During the first week after delivery, the amount and appearance of lochia is often similar to a menstrual period.  ? Over the next few weeks, it will gradually decrease to a dry, yellow-brown discharge.  ? For most   women, lochia stops completely by 4-6 weeks after delivery. Vaginal bleeding can vary from woman to woman.  · Change your sanitary pads frequently. Watch for any changes in your flow, such as:  ? A sudden increase in volume.  ? A change in color.  ? Large blood clots.  · If you pass a blood clot from your vagina, save it and call your health care provider to discuss. Do not flush blood clots down the toilet before talking with your health care provider.  · Do not use tampons or douches until your health care provider says this is safe.  · If you are not breastfeeding, your period should return 6-8 weeks after delivery. If you are feeding your child breast milk only (exclusive breastfeeding), your period may not return until you stop breastfeeding.  Perineal care  · Keep the area between the vagina and the anus (perineum) clean and dry as told by your health care provider. Use medicated pads and pain-relieving sprays and creams as directed.  · If you had a cut in the perineum (episiotomy) or a tear in the vagina, check  the area for signs of infection until you are healed. Check for:  ? More redness, swelling, or pain.  ? Fluid or blood coming from the cut or tear.  ? Warmth.  ? Pus or a bad smell.  · You may be given a squirt bottle to use instead of wiping to clean the perineum area after you go to the bathroom. As you start healing, you may use the squirt bottle before wiping yourself. Make sure to wipe gently.  · To relieve pain caused by an episiotomy, a tear in the vagina, or swollen veins in the anus (hemorrhoids), try taking a warm sitz bath 2-3 times a day. A sitz bath is a warm water bath that is taken while you are sitting down. The water should only come up to your hips and should cover your buttocks.  Breast care  · Within the first few days after delivery, your breasts may feel heavy, full, and uncomfortable (breast engorgement). Milk may also leak from your breasts. Your health care provider can suggest ways to help relieve the discomfort. Breast engorgement should go away within a few days.  · If you are breastfeeding:  ? Wear a bra that supports your breasts and fits you well.  ? Keep your nipples clean and dry. Apply creams and ointments as told by your health care provider.  ? You may need to use breast pads to absorb milk that leaks from your breasts.  ? You may have uterine contractions every time you breastfeed for up to several weeks after delivery. Uterine contractions help your uterus return to its normal size.  ? If you have any problems with breastfeeding, work with your health care provider or lactation consultant.  · If you are not breastfeeding:  ? Avoid touching your breasts a lot. Doing this can make your breasts produce more milk.  ? Wear a good-fitting bra and use cold packs to help with swelling.  ? Do not squeeze out (express) milk. This causes you to make more milk.  Intimacy and sexuality  · Ask your health care provider when you can engage in sexual activity. This may depend on:  ? Your risk  of infection.  ? How fast you are healing.  ? Your comfort and desire to engage in sexual activity.  · You are able to get pregnant after delivery, even if   you have not had your period. If desired, talk with your health care provider about methods of birth control (contraception).  Medicines  · Take over-the-counter and prescription medicines only as told by your health care provider.  · If you were prescribed an antibiotic medicine, take it as told by your health care provider. Do not stop taking the antibiotic even if you start to feel better.  Activity  · Gradually return to your normal activities as told by your health care provider. Ask your health care provider what activities are safe for you.  · Rest as much as possible. Try to rest or take a nap while your baby is sleeping.  Eating and drinking    · Drink enough fluid to keep your urine pale yellow.  · Eat high-fiber foods every day. These may help prevent or relieve constipation. High-fiber foods include:  ? Whole grain cereals and breads.  ? Brown rice.  ? Beans.  ? Fresh fruits and vegetables.  · Do not try to lose weight quickly by cutting back on calories.  · Take your prenatal vitamins until your postpartum checkup or until your health care provider tells you it is okay to stop.  Lifestyle  · Do not use any products that contain nicotine or tobacco, such as cigarettes and e-cigarettes. If you need help quitting, ask your health care provider.  · Do not drink alcohol, especially if you are breastfeeding.  General instructions  · Keep all follow-up visits for you and your baby as told by your health care provider. Most women visit their health care provider for a postpartum checkup within the first 3-6 weeks after delivery.  Contact a health care provider if:  · You feel unable to cope with the changes that your child brings to your life, and these feelings do not go away.  · You feel unusually sad or worried.  · Your breasts become red, painful, or  hard.  · You have a fever.  · You have trouble holding urine or keeping urine from leaking.  · You have little or no interest in activities you used to enjoy.  · You have not breastfed at all and you have not had a menstrual period for 12 weeks after delivery.  · You have stopped breastfeeding and you have not had a menstrual period for 12 weeks after you stopped breastfeeding.  · You have questions about caring for yourself or your baby.  · You pass a blood clot from your vagina.  Get help right away if:  · You have chest pain.  · You have difficulty breathing.  · You have sudden, severe leg pain.  · You have severe pain or cramping in your lower abdomen.  · You bleed from your vagina so much that you fill more than one sanitary pad in one hour. Bleeding should not be heavier than your heaviest period.  · You develop a severe headache.  · You faint.  · You have blurred vision or spots in your vision.  · You have bad-smelling vaginal discharge.  · You have thoughts about hurting yourself or your baby.  If you ever feel like you may hurt yourself or others, or have thoughts about taking your own life, get help right away. You can go to the nearest emergency department or call:  · Your local emergency services (911 in the [place]  · A suicide crisis helpline, such as the National Suicide Prevention Lifeline at 1-800-273-8255. This is open 24   hours a day.  Summary  · The period of time right after you deliver your newborn up to 6-12 weeks after delivery is called the postpartum period.  · Gradually return to your normal activities as told by your health care provider.  · Keep all follow-up visits for you and your baby as told by your health care provider.  This information is not intended to replace advice given to you by your health care provider. Make sure you discuss any questions you have with your health care provider.  Document Released: 12/29/2006 Document Revised: 03/06/2017 Document Reviewed:  12/15/2016  Elsevier Patient Education © 2020 Elsevier Inc.

## 2019-01-07 LAB — CBC WITH DIFFERENTIAL/PLATELET
Abs Immature Granulocytes: 0.2 10*3/uL — ABNORMAL HIGH (ref 0.00–0.07)
Basophils Absolute: 0.1 10*3/uL (ref 0.0–0.1)
Basophils Relative: 1 %
Eosinophils Absolute: 0.2 10*3/uL (ref 0.0–0.5)
Eosinophils Relative: 2 %
HCT: 36.2 % (ref 36.0–46.0)
Hemoglobin: 12.4 g/dL (ref 12.0–15.0)
Immature Granulocytes: 2 %
Lymphocytes Relative: 30 %
Lymphs Abs: 3 10*3/uL (ref 0.7–4.0)
MCH: 32.2 pg (ref 26.0–34.0)
MCHC: 34.3 g/dL (ref 30.0–36.0)
MCV: 94 fL (ref 80.0–100.0)
Monocytes Absolute: 0.7 10*3/uL (ref 0.1–1.0)
Monocytes Relative: 7 %
Neutro Abs: 5.8 10*3/uL (ref 1.7–7.7)
Neutrophils Relative %: 58 %
Platelets: 104 10*3/uL — ABNORMAL LOW (ref 150–400)
RBC: 3.85 MIL/uL — ABNORMAL LOW (ref 3.87–5.11)
RDW: 13.3 % (ref 11.5–15.5)
WBC: 9.9 10*3/uL (ref 4.0–10.5)
nRBC: 0 % (ref 0.0–0.2)

## 2019-01-07 NOTE — Discharge Summary (Addendum)
Postpartum Discharge Summary    Patient Name: Amber [name] DOB: [birth year] MRN: [id number]  Date of admission: 01/04/2019 Delivering Provider: Clarnce Flock   Date of discharge: 01/07/2019  Admitting diagnosis: CTX Intrauterine pregnancy: [redacted]w[redacted]d    Secondary diagnosis:  Active Problems:   Bipolar 1 disorder ([place]   Supervision of other normal pregnancy, antepartum   AMA (advanced maternal age) multigravida 35+   Late prenatal care affecting pregnancy in second trimester   History of preterm delivery   History of VBAC   Increased SMA risk on [place]   Gestational diabetes   Uterine contractions during pregnancy   [place]rombocytopenia affecting [place] ([place]   Severe preeclampsia  Additional problems:      Discharge diagnosis: Term Pregnancy Delivered, VBAC and GDM A1                                                                                                Post partum procedures:None  Augmentation: None  Complications: None  Hospital course:  Onset of Labor With Vaginal Delivery     38y.o. yo [id number] admitted in Active Labor on 01/04/2019. Patient had an uncomplicated labor course as follows: Arrived to MAU 6cm, was 10cm on arrival to floor and had precipitous delivery.  Membrane Rupture Time/Date: 12:12 AM ,01/05/2019   Intrapartum Procedures: Episiotomy: None [1]                                         Lacerations:  Periurethral [8]  Patient had a delivery of a Viable infant. 01/05/2019  Information for the patient's newborn:  OwJeryl, [place]0[[id number]]Delivery Method: VBAC, Spontaneous(Filed from Delivery Summary)     Pateint had an PP course notable for PP diagnosis of Pre-eclampsia with severe features, diagnosed by mild range blood pressures and platelets less than 100k. Counseled on warning signs of pre-eclampsia, had BP check scheduled within 1 week.  At time of discharge she was ambulating, tolerating a regular diet, passing  flatus, and urinating well. Patient is discharged home in stable condition on 01/07/19.  Delivery time: 12:21 AM    Magnesium Sulfate received: No BMZ received: No Rhophylac:N/A MMR:N/A Transfusion:No  Physical exam  Vitals:   01/06/19 0547 01/06/19 1617 01/06/19 2127 01/07/19 0612  BP: 119/76 118/78 (!) 111/93 95/60  Pulse: 87 81 90 80  Resp: _0 Temp: 98 F (36.7 C) 98.2 F (36.8 C) 98.2 F (36.8 C)   TempSrc: Oral Oral Oral   SpO2: 100%  100% 100%   General: alert, cooperative and no distress Lochia: appropriate Uterine Fundus: firm Incision: N/A DVT Evaluation: No evidence of DVT seen on physical exam. No significant calf/ankle edema. Labs: Lab Results  Component Value Date   WBC 9.9 01/07/2019   HGB 12.4 01/07/2019   HCT 36.2 01/07/2019   MCV 94.0 01/07/2019   PLT 104 (L) 01/07/2019   CMP Latest Ref Rng & Units 01/06/2019  Glucose 70 - 99 mg/dL 76  BUN 6 - 20 mg/dL 9  Creatinine 0.44 - 1.00 mg/dL 0.93  Sodium 135 - 145 mmol/L 135  Potassium 3.5 - 5.1 mmol/L 4.1  Chloride 98 - 111 mmol/L 106  CO2 22 - 32 mmol/L 22  Calcium 8.9 - 10.3 mg/dL 8.7(L)  Total Protein 6.5 - 8.1 g/dL 5.1(L)  Total Bilirubin 0.3 - 1.2 mg/dL 0.3  Alkaline Phos 38 - 126 U/L 231(H)  AST 15 - 41 U/L 26  ALT 0 - 44 U/L 14    Discharge instruction: per After Visit Summary and "Baby and Me Booklet".  After visit meds:  Allergies as of 01/07/2019   No Known Allergies     Medication List    STOP taking these medications   Accu-Chek FastClix Lancets Misc   Accu-Chek Guide test strip Generic drug: glucose blood   Accu-Chek Guide w/Device Kit   cefadroxil 500 MG capsule Commonly known as: [place] Supp Sm Misc   metroNIDAZOLE 500 MG tablet Commonly known as: FLAGYL   miconazole 2 % vaginal cream Commonly known as: MONISTAT 7   phenylephrine-shark liver oil-mineral oil-petrolatum 0.25-3-14-71.9 % rectal ointment Commonly known as:  PREPARATION H   sulfamethoxazole-trimethoprim 800-160 MG tablet Commonly known as: BACTRIM DS     TAKE these medications   acetaminophen 325 MG tablet Commonly known as: Tylenol Take 2 tablets (650 mg total) by mouth every 4 (four) hours as needed (for pain scale < 4).   aspirin EC 81 MG tablet Take 1 tablet (81 mg total) by mouth daily. Take after 12 weeks for prevention of preeclampsia later in pregnancy   Blood Pressure Monitor Kit 1 kit by Does not apply route once a week. Check BP weekly,  Regular Cuff.  DX:   O09.90          Z13.6   Blood Pressure Kit Devi 1 Device by Does not apply route as needed (Monitor BP at home regularly z34.90 standard size).   famotidine 20 MG tablet Commonly known as: Pepcid Take 1 tablet (20 mg total) by mouth 2 (two) times daily.   ibuprofen 800 MG tablet Commonly known as: ADVIL Take 1 tablet (800 mg total) by mouth every 8 (eight) hours as needed.   prenatal multivitamin Tabs tablet Take 1 tablet by mouth daily.     ASK your doctor about these medications   polyethylene glycol powder 17 GM/SCOOP powder Commonly known as: GLYCOLAX/MIRALAX Take 255 g by mouth once for 1 dose. Ask about: Should I take this medication?       Diet: routine diet  Activity: Advance as tolerated. Pelvic rest for 6 weeks.   Outpatient follow up:6 weeks Follow up Appt: Future Appointments  Date Time Provider [place]  01/11/2019 11:30 AM [place] None  02/03/2019  1:00 PM Sloan Leiter, MD [place] None  02/16/2019  8:30 AM [facility] None   Follow up Visit:   Please schedule this patient for Postpartum visit in: 6 weeks with the following provider: Any provider For C/S patients schedule nurse incision check in weeks 2 weeks: no High risk pregnancy complicated by: GDM Delivery mode:  SVD Anticipated Birth Control:  Depo PP Procedures needed: BP check, 2hr GTT  Schedule Integrated BH visit: no   Newborn  Data: Live born female  Birth Weight: 6 lb 2.4 oz (2790 g) APGAR: 8, 9  Newborn Delivery   Birth date/time: 01/05/2019 00:21:00 Delivery type: VBAC, Spontaneous  Baby Feeding: Bottle Disposition:home with mother   01/07/2019 Alexis Frock, MD  OB FELLOW DISCHARGE ATTESTATION  I have seen and examined this patient and agree with above documentation in the resident's note.   Phill Myron, D.O. OB Fellow  01/07/2019, 11:26 AM

## 2019-01-11 ENCOUNTER — Other Ambulatory Visit: Payer: Self-pay

## 2019-01-11 ENCOUNTER — Ambulatory Visit (INDEPENDENT_AMBULATORY_CARE_PROVIDER_SITE_OTHER): Payer: Medicaid Other

## 2019-01-11 ENCOUNTER — Encounter: Payer: Medicaid Other | Admitting: Advanced Practice Midwife

## 2019-01-11 VITALS — BP 120/80 | HR 82

## 2019-01-11 DIAGNOSIS — Z0130 Encounter for examination of blood pressure without abnormal findings: Secondary | ICD-10-CM

## 2019-01-11 NOTE — Progress Notes (Signed)
Pt is here for a bp check.  Pt denied headache, dizziness, and blurred vision.  Pt bp was normal. -EH/RMA

## 2019-01-28 ENCOUNTER — Other Ambulatory Visit: Payer: Self-pay | Admitting: Obstetrics

## 2019-01-28 ENCOUNTER — Telehealth: Payer: Self-pay

## 2019-01-28 DIAGNOSIS — N61 Mastitis without abscess: Secondary | ICD-10-CM

## 2019-01-28 MED ORDER — AMOXICILLIN-POT CLAVULANATE 875-125 MG PO TABS: 1.0000 | ORAL_TABLET | Freq: Two times a day (BID) | ORAL | 0 refills | Status: DC

## 2019-01-28 NOTE — Telephone Encounter (Signed)
Attempted to contact pt regarding vm. Consulted with provider Rx sent Pt was not ava left detailed vm and sent MyChart message to make pt aware.

## 2019-01-28 NOTE — Telephone Encounter (Signed)
Augmentin Rx for mastitis

## 2019-02-03 ENCOUNTER — Ambulatory Visit: Payer: Medicaid Other | Admitting: Obstetrics and Gynecology

## 2019-02-08 ENCOUNTER — Ambulatory Visit: Payer: Medicaid Other | Admitting: Advanced Practice Midwife

## 2019-02-16 ENCOUNTER — Other Ambulatory Visit: Payer: Medicaid Other

## 2019-02-16 ENCOUNTER — Other Ambulatory Visit: Payer: Self-pay

## 2019-02-21 ENCOUNTER — Ambulatory Visit: Payer: Medicaid Other | Admitting: Obstetrics

## 2019-02-22 ENCOUNTER — Other Ambulatory Visit: Payer: Self-pay

## 2019-02-22 ENCOUNTER — Other Ambulatory Visit: Payer: Medicaid Other

## 2019-02-22 ENCOUNTER — Ambulatory Visit (INDEPENDENT_AMBULATORY_CARE_PROVIDER_SITE_OTHER): Payer: Medicaid Other | Admitting: Obstetrics

## 2019-02-22 DIAGNOSIS — Z1389 Encounter for screening for other disorder: Secondary | ICD-10-CM | POA: Diagnosis not present

## 2019-02-22 DIAGNOSIS — Z8632 Personal history of gestational diabetes: Secondary | ICD-10-CM

## 2019-02-22 DIAGNOSIS — O24419 Gestational diabetes mellitus in pregnancy, unspecified control: Secondary | ICD-10-CM

## 2019-02-22 NOTE — Progress Notes (Signed)
Post Partum Exam  Amber [name] is a 38 y.o. [phone] female who presents for a postpartum visit. She is 6 weeks postpartum following a spontaneous vaginal delivery. I have fully reviewed the prenatal and intrapartum course. The delivery was at 37.1 gestational weeks.  Anesthesia: none. Postpartum course has been doing well. Baby's course has been doing well. Baby is feeding by bottle - gerber soy. Bleeding no bleeding. Bowel function is normal. Bladder function is normal. Patient is sexually active. Contraception method is none. Pt received Depo before discharge.  Postpartum depression screening:neg, score 6.   The following portions of the patient's history were reviewed and updated as appropriate: allergies, current medications, past family history, past medical history, past social history, past surgical history and problem list. Last pap smear done 09/2018 and was Normal  Review of Systems A comprehensive review of systems was negative.    Objective:  Last menstrual period 03/31/2018, unknown if currently breastfeeding.  General:  alert and no distress   Breasts:  inspection negative, no nipple discharge or bleeding, no masses or nodularity palpable  Lungs: clear to auscultation bilaterally  Heart:  regular rate and rhythm, S1, S2 normal, no murmur, click, rub or gallop  Abdomen: soft, non-tender; bowel sounds normal; no masses,  no organomegaly   Vulva:  normal  Vagina: normal vagina, no discharge, exudate, lesion, or erythema  Cervix:  no cervical motion tenderness and no lesions  Corpus: normal size, contour, position, consistency, mobility, non-tender  Adnexa:  no mass, fullness, tenderness  Rectal Exam: Not performed.        Assessment:    1. History of diet controlled gestational diabetes mellitus (GDM) Rx: - Glucose tolerance, 2 hours  2. Postpartum care following vaginal delivery   Plan:   1. Contraception: cosidering options 2. Continue PNV's 3. Follow up in: 6  weeks or as needed.    Shelly Bombard, MD 02/23/2019 4:45 PM

## 2019-02-23 ENCOUNTER — Encounter: Payer: Self-pay | Admitting: Obstetrics

## 2019-02-23 LAB — GLUCOSE TOLERANCE, 2 HOURS
Glucose, 2 hour: 66 mg/dL (ref 65–139)
Glucose, GTT - Fasting: 76 mg/dL (ref 65–99)

## 2019-03-28 ENCOUNTER — Ambulatory Visit (INDEPENDENT_AMBULATORY_CARE_PROVIDER_SITE_OTHER): Payer: Medicaid Other

## 2019-03-28 ENCOUNTER — Other Ambulatory Visit: Payer: Self-pay

## 2019-03-28 VITALS — BP 110/77 | HR 105 | Ht 61.0 in | Wt 129.0 lb

## 2019-03-28 DIAGNOSIS — Z3042 Encounter for surveillance of injectable contraceptive: Secondary | ICD-10-CM | POA: Diagnosis not present

## 2019-03-28 MED ORDER — MEDROXYPROGESTERONE ACETATE 150 MG/ML IM SUSP: 150.0000 mg | INTRAMUSCULAR | 3 refills | Status: DC

## 2019-03-28 MED ORDER — MEDROXYPROGESTERONE ACETATE 150 MG/ML IM SUSP
150.0000 mg | Freq: Once | INTRAMUSCULAR | Status: AC
  Administered 2019-03-28: 150 mg via INTRAMUSCULAR

## 2019-03-28 NOTE — Progress Notes (Signed)
GYN presents for DEPO, given in  RUOQ, tolerated well.  Next DEPO March 29 - June 27, 2019  Administrations This Visit    medroxyPROGESTERone (DEPO-PROVERA) injection 150 mg    Admin Date 03/28/2019 Action Given Dose 150 mg Route Intramuscular Administered By Maretta Bees, RMA

## 2019-04-18 ENCOUNTER — Ambulatory Visit (HOSPITAL_COMMUNITY)
Admission: EM | Admit: 2019-04-18 | Discharge: 2019-04-18 | Disposition: A | Discharge status: Home / Self Care | Payer: Medicaid Other | Attending: Family Medicine | Admitting: Family Medicine

## 2019-04-18 ENCOUNTER — Encounter (HOSPITAL_COMMUNITY): Payer: Self-pay

## 2019-04-18 ENCOUNTER — Other Ambulatory Visit: Payer: Self-pay

## 2019-04-18 DIAGNOSIS — K0889 Other specified disorders of teeth and supporting structures: Secondary | ICD-10-CM | POA: Diagnosis not present

## 2019-04-18 DIAGNOSIS — K047 Periapical abscess without sinus: Secondary | ICD-10-CM

## 2019-04-18 MED ORDER — HYDROCODONE-ACETAMINOPHEN 7.5-325 MG PO TABS: 1.0000 | ORAL_TABLET | Freq: Four times a day (QID) | ORAL | 0 refills | Status: DC | PRN

## 2019-04-18 MED ORDER — IBUPROFEN 800 MG PO TABS: 800.0000 mg | ORAL_TABLET | Freq: Three times a day (TID) | ORAL | 0 refills | Status: DC

## 2019-04-18 MED ORDER — PENICILLIN V POTASSIUM 500 MG PO TABS: 500.0000 mg | ORAL_TABLET | Freq: Four times a day (QID) | ORAL | 0 refills | Status: AC

## 2019-04-18 NOTE — Discharge Instructions (Signed)
Take the antibiotic 4 times a day Take ibuprofen 3 times a day with food Take the Norco/hydrocodone as needed for severe pain.  Do not drive on hydrocodone. You must follow-up with a dentist.

## 2019-04-18 NOTE — ED Triage Notes (Signed)
Patient presents to Urgent Care with complaints of dental pain on the right side since 2-3 days ago. Patient reports it makes it difficult for her to eat.

## 2019-04-18 NOTE — ED Provider Notes (Addendum)
[place]    CSN: [id number] Arrival date & time: 04/18/19  1609      History   Chief Complaint Chief Complaint  Patient presents with  . Dental Pain    HPI Amber [name] is a 39 y.o. female.   HPI  Patient has severe dental pain for 2 days.  This morning she woke up to swelling on the right side of her face.  She was unable to go to work.  Is aware that she needs dental work for fractures  Past Medical History:  Diagnosis Date  . Anemia   . Anxiety   . Bipolar 1 disorder ([place])   . Depression   . Gestational diabetes 12/02/2018  . Headache   . Pregnancy induced hypertension     Patient Active Problem List   Diagnosis Date Noted  . Thrombocytopenia affecting pregnancy ([place]) 01/06/2019  . Severe preeclampsia 01/06/2019  . Uterine contractions during pregnancy 01/04/2019  . Gestational diabetes 12/02/2018  . Increased SMA risk on [place] 10/25/2018  . AMA (advanced maternal age) multigravida 35+ 10/11/2018  . Late prenatal care affecting pregnancy in second trimester 10/11/2018  . History of preterm delivery 10/11/2018  . History of VBAC 10/11/2018  . Supervision of other normal pregnancy, antepartum 09/27/2018  . Bipolar 1 disorder [facility])     Past Surgical History:  Procedure Laterality Date  . CESAREAN SECTION      OB History    Gravida  5   Para  4   Term  3   Preterm  1   AB  1   Living  4     SAB  0   TAB  0   Ectopic  0   Multiple  0   Live Births  3            Home Medications    Prior to Admission medications   Medication Sig Start Date End Date Taking? Authorizing Provider  HYDROcodone-acetaminophen (NORCO) 7.5-325 MG tablet Take 1 tablet by mouth every 6 (six) hours as needed for moderate pain. 04/18/19   Raylene Everts, MD  ibuprofen (ADVIL) 800 MG tablet Take 1 tablet (800 mg total) by mouth 3 (three) times daily. 04/18/19   Raylene Everts, MD  medroxyPROGESTERone (DEPO-PROVERA) 150 MG/ML injection  Inject 1 mL (150 mg total) into the muscle every 3 (three) months. 03/28/19   Shelly Bombard, MD  penicillin v potassium (VEETID) 500 MG tablet Take 1 tablet (500 mg total) by mouth 4 (four) times daily for 10 days. 04/18/19 04/28/19  Raylene Everts, MD  Prenatal Vit-Fe Fumarate-FA (PRENATAL MULTIVITAMIN) TABS Take 1 tablet by mouth daily.    [provider]  famotidine (PEPCID) 20 MG tablet Take 1 tablet (20 mg total) by mouth 2 (two) times daily. Patient not taking: Reported on 02/22/2019 12/31/18 04/18/19  Elvera Maria, CNM    Family History Family History  Problem Relation Age of Onset  . Arthritis Mother   . Hypertension Mother   . Alcohol abuse Neg Hx   . Asthma Neg Hx   . Birth defects Neg Hx   . Cancer Neg Hx   . COPD Neg Hx   . Depression Neg Hx   . Diabetes Neg Hx   . Drug abuse Neg Hx   . Early death Neg Hx   . Heart disease Neg Hx   . Hearing loss Neg Hx   . Hyperlipidemia Neg Hx   . Kidney  disease Neg Hx   . Learning disabilities Neg Hx   . Mental illness Neg Hx   . Mental retardation Neg Hx   . Miscarriages / Stillbirths Neg Hx   . Stroke Neg Hx   . Vision loss Neg Hx   . Varicose Veins Neg Hx     Social History Social History   Tobacco Use  . Smoking status: Current Every Day Smoker    Packs/day: 1.00    Types: Cigarettes  . Smokeless tobacco: Never Used  Substance Use Topics  . Alcohol use: Yes    Comment: socially  . Drug use: No     Allergies   Patient has no known allergies.   Review of Systems Review of Systems  HENT: Positive for dental problem.      Physical Exam Triage Vital Signs ED Triage Vitals  Enc Vitals Group     BP 04/18/19 1632 129/87     Pulse Rate 04/18/19 1632 84     Resp 04/18/19 1632 16     Temp 04/18/19 1632 98.3 F (36.8 C)     Temp Source 04/18/19 1632 Oral     SpO2 04/18/19 1632 100 %     Weight --      Height --      Head Circumference --      Peak Flow --      Pain Score 04/18/19  1630 7     Pain Loc --      Pain Edu? --      Excl. in GC? --    No data found.  Updated Vital Signs BP 129/87 (BP Location: Right Arm)   Pulse 84   Temp 98.3 F (36.8 C) (Oral)   Resp 16   SpO2 100%      Physical Exam Constitutional:      General: She is not in acute distress.    Appearance: Normal appearance. She is well-developed and normal weight.     Comments: Appears uncomfortable  HENT:     Head: Normocephalic and atraumatic.      Mouth/Throat:   Eyes:     Conjunctiva/sclera: Conjunctivae normal.     Pupils: Pupils are equal, round, and reactive to light.  Cardiovascular:     Rate and Rhythm: Normal rate.  Pulmonary:     Effort: Pulmonary effort is normal. No respiratory distress.  Abdominal:     General: There is no distension.     Palpations: Abdomen is soft.  Musculoskeletal:        General: Normal range of motion.     Cervical back: Normal range of motion.  Skin:    General: Skin is warm and dry.  Neurological:     Mental Status: She is alert.      UC Treatments / Results  Labs (all labs ordered are listed, but only abnormal results are displayed) Labs Reviewed - No data to display  EKG   Radiology No results found.  Procedures Procedures (including critical care time)  Medications Ordered in UC Medications - No data to display  Initial Impression / Assessment and Plan / UC Course  I have reviewed the triage vital signs and the nursing notes.  Pertinent labs & imaging results that were available during my care of the patient were reviewed by me and considered in my medical decision making (see chart for details).      Final Clinical Impressions(s) / UC Diagnoses   Final diagnoses:  Pain, dental  Dental infection  Discharge Instructions     Take the antibiotic 4 times a day Take ibuprofen 3 times a day with food Take the Norco/hydrocodone as needed for severe pain.  Do not drive on hydrocodone. You must follow-up with a  dentist.    ED Prescriptions    Medication Sig Dispense Auth. Provider   penicillin v potassium (VEETID) 500 MG tablet Take 1 tablet (500 mg total) by mouth 4 (four) times daily for 10 days. 40 tablet Eustace Moore, MD   HYDROcodone-acetaminophen [facility]) 7.5-325 MG tablet Take 1 tablet by mouth every 6 (six) hours as needed for moderate pain. 10 tablet Eustace Moore, MD   ibuprofen (ADVIL) 800 MG tablet Take 1 tablet (800 mg total) by mouth 3 (three) times daily. 21 tablet Eustace Moore, MD     I have reviewed the PDMP during this encounter.   Eustace Moore, MD 04/18/19 2014    Eustace Moore, MD 04/18/19 2014

## 2019-06-20 ENCOUNTER — Ambulatory Visit: Payer: Medicaid Other

## 2019-06-27 ENCOUNTER — Ambulatory Visit: Payer: Medicaid Other

## 2019-06-30 ENCOUNTER — Other Ambulatory Visit: Payer: Self-pay

## 2019-06-30 ENCOUNTER — Ambulatory Visit (INDEPENDENT_AMBULATORY_CARE_PROVIDER_SITE_OTHER): Payer: Medicaid Other

## 2019-06-30 ENCOUNTER — Ambulatory Visit: Payer: Medicaid Other

## 2019-06-30 DIAGNOSIS — Z3042 Encounter for surveillance of injectable contraceptive: Secondary | ICD-10-CM | POA: Diagnosis not present

## 2019-06-30 MED ORDER — MEDROXYPROGESTERONE ACETATE 150 MG/ML IM SUSP
150.0000 mg | Freq: Once | INTRAMUSCULAR | Status: AC
  Administered 2019-06-30: 150 mg via INTRAMUSCULAR

## 2019-06-30 NOTE — Progress Notes (Signed)
Amber [name] is here for a depo injection.  Pt tolerated injection well in the LUOQ with complication. Pt advised to make next appointment in 12 weeks. -EH/RMA

## 2019-07-01 NOTE — Progress Notes (Signed)
Patient seen and assessed by nursing staff during this encounter. I have reviewed the chart and agree with the documentation and plan. I have also made any necessary editorial changes.  Dannie Hattabaugh, MD 07/01/2019 8:47 AM    

## 2019-09-20 ENCOUNTER — Ambulatory Visit: Payer: Medicaid Other

## 2021-03-17 NOTE — L&D Delivery Note (Signed)
LABOR COURSE   Delivery Note Called to room and patient was sitting up for epidural pushing. Head delivered 2101. No nuchal cord present. Shoulder and body delivered in usual fashion after decreased maternal effort due to pain. At 2102 a viable female was delivered via Vaginal, Spontaneous (Presentation: ROA). Infant with spontaneous cry, placed on mother's abdomen, dried and stimulated. Cord clamped x 2 after 1-minute delay, and cut by sister. Cord blood drawn. Placenta delivered spontaneously with gentle cord traction. Appears intact. Fundus firm with massage and Pitocin. Labia, perineum, vagina, and cervix inspected with 4x4, appears intact.    APGAR:8 ,9 ; weight: pending see delivery summary.   Cord: 3VC with the following complications:None.     Anesthesia: Epidural Episiotomy: None Lacerations: None Suture Repair: N/A Est. Blood Loss (mL): 125  Mom to postpartum.  Baby girl to Couplet care / Skin to Skin.  [place], [place] 04/23/21 9:16 PM

## 2021-04-08 ENCOUNTER — Encounter (HOSPITAL_COMMUNITY): Payer: Self-pay | Admitting: *Deleted

## 2021-04-08 ENCOUNTER — Inpatient Hospital Stay (HOSPITAL_COMMUNITY)
Admission: AD | Admit: 2021-04-08 | Discharge: 2021-04-09 | Disposition: A | Discharge status: Home / Self Care | Payer: Medicaid Other | Attending: Obstetrics and Gynecology | Admitting: Obstetrics and Gynecology

## 2021-04-08 ENCOUNTER — Inpatient Hospital Stay (HOSPITAL_BASED_OUTPATIENT_CLINIC_OR_DEPARTMENT_OTHER): Payer: Medicaid Other

## 2021-04-08 DIAGNOSIS — O0933 Supervision of pregnancy with insufficient antenatal care, third trimester: Secondary | ICD-10-CM | POA: Insufficient documentation

## 2021-04-08 DIAGNOSIS — Z3A34 34 weeks gestation of pregnancy: Secondary | ICD-10-CM | POA: Diagnosis not present

## 2021-04-08 DIAGNOSIS — O26893 Other specified pregnancy related conditions, third trimester: Secondary | ICD-10-CM | POA: Insufficient documentation

## 2021-04-08 DIAGNOSIS — R109 Unspecified abdominal pain: Secondary | ICD-10-CM | POA: Diagnosis not present

## 2021-04-08 DIAGNOSIS — O133 Gestational [pregnancy-induced] hypertension without significant proteinuria, third trimester: Secondary | ICD-10-CM | POA: Insufficient documentation

## 2021-04-08 DIAGNOSIS — F17210 Nicotine dependence, cigarettes, uncomplicated: Secondary | ICD-10-CM | POA: Diagnosis not present

## 2021-04-08 DIAGNOSIS — O09523 Supervision of elderly multigravida, third trimester: Secondary | ICD-10-CM | POA: Diagnosis not present

## 2021-04-08 DIAGNOSIS — Z3689 Encounter for other specified antenatal screening: Secondary | ICD-10-CM

## 2021-04-08 DIAGNOSIS — O3433 Maternal care for cervical incompetence, third trimester: Secondary | ICD-10-CM | POA: Insufficient documentation

## 2021-04-08 DIAGNOSIS — O99333 Smoking (tobacco) complicating pregnancy, third trimester: Secondary | ICD-10-CM | POA: Diagnosis not present

## 2021-04-08 DIAGNOSIS — O0930 Supervision of pregnancy with insufficient antenatal care, unspecified trimester: Secondary | ICD-10-CM

## 2021-04-08 DIAGNOSIS — R1030 Lower abdominal pain, unspecified: Secondary | ICD-10-CM | POA: Insufficient documentation

## 2021-04-08 LAB — COMPREHENSIVE METABOLIC PANEL
ALT: 9 U/L (ref 0–44)
AST: 14 U/L — ABNORMAL LOW (ref 15–41)
Albumin: 3 g/dL — ABNORMAL LOW (ref 3.5–5.0)
Alkaline Phosphatase: 169 U/L — ABNORMAL HIGH (ref 38–126)
Anion gap: 10 (ref 5–15)
BUN: <5 mg/dL — ABNORMAL LOW (ref 6–20)
CO2: 22 mmol/L (ref 22–32)
Calcium: 8.4 mg/dL — ABNORMAL LOW (ref 8.9–10.3)
Chloride: 104 mmol/L (ref 98–111)
Creatinine, Ser: 0.63 mg/dL (ref 0.44–1.00)
GFR, Estimated: >60 mL/min (ref >60)
Glucose, Bld: 79 mg/dL (ref 70–99)
Potassium: 3.4 mmol/L — ABNORMAL LOW (ref 3.5–5.1)
Sodium: 136 mmol/L (ref 135–145)
Total Bilirubin: 0.6 mg/dL (ref 0.3–1.2)
Total Protein: 6.4 g/dL — ABNORMAL LOW (ref 6.5–8.1)

## 2021-04-08 LAB — WET PREP, GENITAL
Sperm: NONE SEEN
Trich, Wet Prep: NONE SEEN
WBC, Wet Prep HPF POC: <10 (ref <10)
Yeast Wet Prep HPF POC: NONE SEEN

## 2021-04-08 LAB — URINALYSIS, ROUTINE W REFLEX MICROSCOPIC
Bacteria, UA: NONE SEEN
Bilirubin Urine: NEGATIVE
Glucose, UA: NEGATIVE mg/dL
Ketones, ur: NEGATIVE mg/dL
Leukocytes,Ua: NEGATIVE
Nitrite: NEGATIVE
Protein, ur: NEGATIVE mg/dL
Specific Gravity, Urine: 1.003 — ABNORMAL LOW (ref 1.005–1.030)
pH: 6 (ref 5.0–8.0)

## 2021-04-08 LAB — RAPID URINE DRUG SCREEN, HOSP PERFORMED
Amphetamines: NOT DETECTED
Barbiturates: NOT DETECTED
Benzodiazepines: NOT DETECTED
Cocaine: NOT DETECTED
Opiates: NOT DETECTED
Tetrahydrocannabinol: POSITIVE — AB

## 2021-04-08 LAB — DIFFERENTIAL
Abs Immature Granulocytes: 0.21 10*3/uL — ABNORMAL HIGH (ref 0.00–0.07)
Basophils Absolute: 0.1 10*3/uL (ref 0.0–0.1)
Basophils Relative: 1 %
Eosinophils Absolute: 1.2 10*3/uL — ABNORMAL HIGH (ref 0.0–0.5)
Eosinophils Relative: 8 %
Immature Granulocytes: 2 %
Lymphocytes Relative: 19 %
Lymphs Abs: 2.7 10*3/uL (ref 0.7–4.0)
Monocytes Absolute: 0.8 10*3/uL (ref 0.1–1.0)
Monocytes Relative: 6 %
Neutro Abs: 9.4 10*3/uL — ABNORMAL HIGH (ref 1.7–7.7)
Neutrophils Relative %: 64 %

## 2021-04-08 LAB — CBC
HCT: 35.1 % — ABNORMAL LOW (ref 36.0–46.0)
Hemoglobin: 11.9 g/dL — ABNORMAL LOW (ref 12.0–15.0)
MCH: 32.2 pg (ref 26.0–34.0)
MCHC: 33.9 g/dL (ref 30.0–36.0)
MCV: 95.1 fL (ref 80.0–100.0)
Platelets: 123 10*3/uL — ABNORMAL LOW (ref 150–400)
RBC: 3.69 MIL/uL — ABNORMAL LOW (ref 3.87–5.11)
RDW: 13.2 % (ref 11.5–15.5)
WBC: 14.3 10*3/uL — ABNORMAL HIGH (ref 4.0–10.5)
nRBC: 0.1 % (ref 0.0–0.2)

## 2021-04-08 LAB — PROTEIN / CREATININE RATIO, URINE
Creatinine, Urine: 24.97 mg/dL
Total Protein, Urine: <6 mg/dL

## 2021-04-08 LAB — HEPATITIS B SURFACE ANTIGEN: Hepatitis B Surface Ag: NONREACTIVE

## 2021-04-08 LAB — HIV ANTIBODY (ROUTINE TESTING W REFLEX): HIV Screen 4th Generation wRfx: NONREACTIVE

## 2021-04-08 MED ORDER — LACTATED RINGERS IV BOLUS
1000.0000 mL | Freq: Once | INTRAVENOUS | Status: AC
  Administered 2021-04-08: 1000 mL via INTRAVENOUS

## 2021-04-08 NOTE — MAU Provider Note (Addendum)
History     CSN: [id number]  Arrival date and time: 04/08/21 1629   Event Date/Time   First Provider Initiated Contact with Patient 04/08/21 1814      Chief Complaint  Patient presents with   Abdominal Pain   Abdominal Pain Associated symptoms include frequency. Pertinent negatives include no dysuria, fever, headaches, hematuria, nausea or vomiting.   Amber [name] is a 41yo [id number] at unknown gestational age who presents for lower abdominal pain and increased thirst. Her OB history if complicated by no prenatal care, history of pre-eclampsia, history of gestational diabetes, history of c-section, and history of preterm birth. Her LMP is unknown; she estimates June-July. The lower abdominal pain is intermittent started 1-2 days ago and worsened last night to where she was unable to sleep. The pain radiates to her inner thighs and feels like contractions. She occasionally feels better with standing and has not tried any medications for the pain. She cannot find a comfortable position and has pain with ambulation. Her feet were swollen on Thursday but that has improved today.   She has had increased thirst and urination for about 1 month. She wakes up multiple times per night to drink water and urinate. No dysuria, burning, itching.  She has felt decreased fetal movements for the past 1-2 weeks but does endorse movement today.   She has a history of high blood pressure and diabetes during her last pregnancy. She did not obtain prenatal care because she was in denial about the pregnancy.  She denies RUQ pain, headache, vision changes.   OB History     Gravida  6   Para  4   Term  3   Preterm  1   AB  1   Living  4      SAB  0   IAB  0   Ectopic  1   Multiple  0   Live Births  4           Past Medical History:  Diagnosis Date   Anemia    Anxiety    Bipolar 1 disorder ([place])    Depression    doing fine   Gestational diabetes 12/02/2018   Headache     Pregnancy induced hypertension     Past Surgical History:  Procedure Laterality Date   CESAREAN SECTION      Family History  Problem Relation Age of Onset   Arthritis Mother    Hypertension Mother    Healthy Father    Alcohol abuse Neg Hx    Asthma Neg Hx    Birth defects Neg Hx    Cancer Neg Hx    COPD Neg Hx    Depression Neg Hx    Diabetes Neg Hx    Drug abuse Neg Hx    Early death Neg Hx    Heart disease Neg Hx    Hearing loss Neg Hx    Hyperlipidemia Neg Hx    Kidney disease Neg Hx    Learning disabilities Neg Hx    Mental illness Neg Hx    Mental retardation Neg Hx    Miscarriages / Stillbirths Neg Hx    Stroke Neg Hx    Vision loss Neg Hx    Varicose Veins Neg Hx     Social History   Tobacco Use   Smoking status: Every Day    Packs/day: 0.50    Years: 21.00    Pack years: 10.50    Types: Cigarettes  Smokeless tobacco: Never  Vaping Use   Vaping Use: Never used  Substance Use Topics   Alcohol use: Not Currently    Comment: socially   Drug use: No    Allergies: No Known Allergies  Medications Prior to Admission  Medication Sig Dispense Refill Last Dose   HYDROcodone-acetaminophen (NORCO) 7.5-325 MG tablet Take 1 tablet by mouth every 6 (six) hours as needed for moderate pain. 10 tablet 0    ibuprofen (ADVIL) 800 MG tablet Take 1 tablet (800 mg total) by mouth 3 (three) times daily. 21 tablet 0    medroxyPROGESTERone (DEPO-PROVERA) 150 MG/ML injection Inject 1 mL (150 mg total) into the muscle every 3 (three) months. 1 mL 3    Prenatal Vit-Fe Fumarate-FA (PRENATAL MULTIVITAMIN) TABS Take 1 tablet by mouth daily.       Review of Systems  Constitutional:  Negative for chills and fever.  HENT:  Negative for congestion, rhinorrhea and sore throat.   Eyes:  Negative for visual disturbance.  Respiratory:  Negative for cough, chest tightness and shortness of breath.   Cardiovascular:  Positive for leg swelling. Negative for chest pain.   Gastrointestinal:  Positive for abdominal pain. Negative for nausea and vomiting.  Endocrine: Positive for polydipsia and polyuria.  Genitourinary:  Positive for frequency. Negative for dysuria, hematuria, vaginal bleeding and vaginal discharge.  Neurological:  Negative for headaches.  Physical Exam   Patient Vitals for the past 24 hrs:  BP Temp Temp src Pulse Resp SpO2 Height Weight  04/08/21 2300 132/85 -- Oral 84 17 100 % -- --  04/08/21 2031 (!) 143/90 -- -- 78 -- 100 % -- --  04/08/21 2015 (!) 140/92 -- -- 79 -- -- -- --  04/08/21 1950 -- -- -- -- -- 100 % -- --  04/08/21 1945 137/80 -- -- 82 -- -- -- --  04/08/21 1940 (!) 143/92 -- -- 87 -- 100 % -- --  04/08/21 1846 122/90 -- -- 86 -- -- -- --  04/08/21 1816 121/89 -- -- 93 -- 100 % -- --  04/08/21 1801 (!) 146/88 -- -- 78 -- 100 % -- --  04/08/21 1748 133/87 -- -- 79 -- -- -- --  04/08/21 1726 (!) 133/95 -- -- 83 -- -- -- --  04/08/21 1723 (!) 140/100 98.1 F (36.7 C) Oral 84 18 100 % 5\' 1"  (1.549 m) 62.7 kg    Physical Exam Exam conducted with a chaperone present.  Constitutional:      General: She is not in acute distress. HENT:     Head: Normocephalic and atraumatic.  Pulmonary:     Effort: Pulmonary effort is normal.  Abdominal:     Palpations: Abdomen is soft.     Tenderness: There is abdominal tenderness in the suprapubic area. There is no guarding or rebound.     Comments: Gravid, fundal height 33-34cm  Genitourinary:    Comments: Cervical exam performed by Maryagnes Amos, CNM. 1.5 / 80 / -2 Skin:    General: Skin is warm and dry.  Neurological:     Mental Status: She is alert.   NST:  FHR: 140 bpm, moderate variability, +15x15 accels, no decels Toco: Q 7-10 mins  MAU Course  Procedures  MDM Will obtain OB panel and full ultrasound given lack of prenatal care and unknown LMP. Fundal height approximately 33-34cm. Cervical exam performed by Maryagnes Amos, CNM was 1.5 / 72 / -2. Speculum exam was  deferred; obtained blind wet prep and g/c  swabs. She is having intermittent contractions, approximately 8-10 mins apart. - U/S for accurate dating - GBS culture - 1L LR bolus - UDS - Urine culture - G/C - Wet prep: positive for BV - HIV antibody with reflex - Hep C antibody with reflex - HbA1c - Urine protein/creatinine ratio - CMP - CBC w diff - RPR - Rubella - Hep B surface antigen - Urinalysis with moderate Hgb  Reggy Eye, Medical Student 04/08/2021, 7:42 PM   CNM attestation:  I have seen and examined this patient and agree with above documentation in the medical student's note. I have performed my own assessment and physical exam. I have updated and made any necessary changes to the note.   Care handed over to E. Purcell Nails, NP at 2100   Renee Harder, CNM 04/08/21 8:57 PM  Ultrasound shows fetus measuring 34 weeks 1 day, EDD updated. On exam, cervix is 2.5 cm-since I am a new examiner to the patient I offered to recheck after an hour to compare my checks.  Cervix remains unchanged.  Patient does not appear uncomfortable.  She desires to be discharged home.  After discussion with Dr. Damita Dunnings, patient was discharged home.  We will send message to office for close follow-up.  At this time patient also meets criteria for gestational hypertension. Will send message to [facility] for close follow-up with MD and antenatal testing.  Assessment and Plan   1. Gestational hypertension, third trimester  -reviewed preeclampsia precautions -message to [facility] for f/u appointment & antenatal testing  2. No prenatal care in current pregnancy  -prenatal labs pending  3. Premature cervical dilation in third trimester  -PTL precautions  4. [redacted] weeks gestation of pregnancy     Jorje Guild, NP

## 2021-04-08 NOTE — MAU Note (Addendum)
Been hurting in lower abd, started a couple days ago and is getting real bad. Hard to sleep. No bleeding or leaking. Has not had any care, been in denial.  Also having pain at top of inner thighs. First time she has been seen. "Has been so thirsty, just can't drink enough".  With last preg she was checking her blood pressure and blood sugar.

## 2021-04-09 LAB — GC/CHLAMYDIA PROBE AMP (~~LOC~~) NOT AT ARMC
Chlamydia: NEGATIVE
Comment: NEGATIVE
Comment: NORMAL
Neisseria Gonorrhea: NEGATIVE

## 2021-04-09 LAB — HEMOGLOBIN A1C
Hgb A1c MFr Bld: 5.7 % — ABNORMAL HIGH (ref 4.8–5.6)
Mean Plasma Glucose: 117 mg/dL

## 2021-04-09 LAB — CULTURE, OB URINE: Culture: NO GROWTH

## 2021-04-09 LAB — RPR: RPR Ser Ql: NONREACTIVE

## 2021-04-10 LAB — OB RESULTS CONSOLE GBS: GBS: NEGATIVE

## 2021-04-10 LAB — CULTURE, BETA STREP (GROUP B ONLY)

## 2021-04-10 LAB — RUBELLA SCREEN: Rubella: 1.13 index (ref >0.99)

## 2021-04-11 ENCOUNTER — Other Ambulatory Visit (HOSPITAL_COMMUNITY)
Admission: RE | Admit: 2021-04-11 | Discharge: 2021-04-11 | Disposition: A | Discharge status: Home / Self Care | Payer: Medicaid Other | Source: Ambulatory Visit | Attending: Obstetrics and Gynecology | Admitting: Obstetrics and Gynecology

## 2021-04-11 ENCOUNTER — Encounter: Payer: Self-pay | Admitting: Obstetrics and Gynecology

## 2021-04-11 ENCOUNTER — Other Ambulatory Visit: Payer: Self-pay

## 2021-04-11 ENCOUNTER — Ambulatory Visit (INDEPENDENT_AMBULATORY_CARE_PROVIDER_SITE_OTHER): Payer: Medicaid Other | Admitting: Obstetrics and Gynecology

## 2021-04-11 VITALS — BP 133/97 | HR 97 | Wt 141.8 lb

## 2021-04-11 DIAGNOSIS — Z3480 Encounter for supervision of other normal pregnancy, unspecified trimester: Secondary | ICD-10-CM | POA: Diagnosis not present

## 2021-04-11 DIAGNOSIS — O09523 Supervision of elderly multigravida, third trimester: Secondary | ICD-10-CM

## 2021-04-11 DIAGNOSIS — O133 Gestational [pregnancy-induced] hypertension without significant proteinuria, third trimester: Secondary | ICD-10-CM

## 2021-04-11 DIAGNOSIS — O09899 Supervision of other high risk pregnancies, unspecified trimester: Secondary | ICD-10-CM | POA: Diagnosis present

## 2021-04-11 DIAGNOSIS — Z8751 Personal history of pre-term labor: Secondary | ICD-10-CM | POA: Diagnosis not present

## 2021-04-11 DIAGNOSIS — Z3A34 34 weeks gestation of pregnancy: Secondary | ICD-10-CM | POA: Diagnosis not present

## 2021-04-11 DIAGNOSIS — Z98891 History of uterine scar from previous surgery: Secondary | ICD-10-CM

## 2021-04-11 DIAGNOSIS — O0932 Supervision of pregnancy with insufficient antenatal care, second trimester: Secondary | ICD-10-CM

## 2021-04-11 DIAGNOSIS — O139 Gestational [pregnancy-induced] hypertension without significant proteinuria, unspecified trimester: Secondary | ICD-10-CM | POA: Insufficient documentation

## 2021-04-11 MED ORDER — BLOOD PRESSURE KIT DEVI: 1.0000 | 0 refills | Status: DC | PRN

## 2021-04-11 MED ORDER — GOJJI WEIGHT SCALE MISC
1.0000 | 0 refills | Status: DC | PRN
Start: 2021-04-11 — End: 2021-04-25

## 2021-04-11 MED ORDER — VITAFOL GUMMIES 3.33-0.333-34.8 MG PO CHEW: 2.0000 | CHEWABLE_TABLET | Freq: Every day | ORAL | 6 refills | Status: DC

## 2021-04-11 NOTE — Progress Notes (Signed)
Subjective:    Amber [name] is a [id number] [redacted]w[redacted]d being seen today for her first obstetrical visit.  Her obstetrical history is significant for advanced maternal age, previous cesarean section followed by 3 VBAC. Patient recently diagnosed with gestational hypertension. History of GDM with recent A1C 5.7 and late onset to care at 34 weeks. Patient does intend to breast feed. Pregnancy history fully reviewed.  Patient reports no complaints.  Vitals:   04/11/21 1539  BP: (!) 133/97  Pulse: 97  Weight: 141 lb 12.8 oz (64.3 kg)    HISTORY: OB History  Gravida Para Term Preterm AB Living  6 4 3 1 1 4   SAB IAB Ectopic Multiple Live Births  0 0 1 0 4    # Outcome Date GA Lbr Len/2nd Weight Sex Delivery Anes PTL Lv  6 Current           5 Term 01/05/19 [redacted]w[redacted]d  6 lb 2.4 oz (2.79 kg) M VBAC None  LIV  4 Preterm 07/27/13 [redacted]w[redacted]d 00:27 / 00:13 3 lb 15.3 oz (1.795 kg) F VBAC EPI  LIV  3 Term 10/23/11 [redacted]w[redacted]d 06:43 / 00:14 5 lb 14 oz (2.665 kg) F VBAC None  LIV  2 Term 08/2009     [id number]-[id number]     1 Ectopic            Past Medical History:  Diagnosis Date   Anemia    Anxiety    Bipolar 1 disorder ([facility])    Depression    doing fine   Gestational diabetes 12/02/2018   Headache    Pregnancy induced hypertension    Past Surgical History:  Procedure Laterality Date   CESAREAN SECTION     Family History  Problem Relation Age of Onset   Arthritis Mother    Hypertension Mother    Healthy Father    Alcohol abuse Neg Hx    Asthma Neg Hx    Birth defects Neg Hx    Cancer Neg Hx    COPD Neg Hx    Depression Neg Hx    Diabetes Neg Hx    Drug abuse Neg Hx    Early death Neg Hx    Heart disease Neg Hx    Hearing loss Neg Hx    Hyperlipidemia Neg Hx    Kidney disease Neg Hx    Learning disabilities Neg Hx    Mental illness Neg Hx    Mental retardation Neg Hx    Miscarriages / Stillbirths Neg Hx    Stroke Neg Hx    Vision loss Neg Hx    Varicose Veins Neg Hx      Exam    Uterus:    35 weeks  Pelvic Exam:    Perineum: Normal Perineum   Vulva: normal   Vagina:  normal mucosa, normal discharge   pH:    Cervix: multiparous appearance   Adnexa: not evaluated   Bony Pelvis: gynecoid  System: Breast:  normal appearance, no masses or tenderness   Skin: normal coloration and turgor, no rashes    Neurologic: oriented, no focal deficits   Extremities: normal strength, tone, and muscle mass   HEENT extra ocular movement intact   Mouth/Teeth mucous membranes moist, pharynx normal without lesions   Neck supple and no masses   Cardiovascular: regular rate and rhythm   Respiratory:  appears well, vitals normal, no respiratory distress, acyanotic, normal RR, chest clear, no wheezing, crepitations, rhonchi, normal symmetric air entry   Abdomen:  soft, non-tender; bowel sounds normal; no masses,  no organomegaly   Urinary:       Assessment:    Pregnancy: [id number] Patient Active Problem List   Diagnosis Date Noted   Supervision of other high risk pregnancy, antepartum 04/11/2021   Gestational hypertension 04/11/2021   Thrombocytopenia affecting pregnancy ([facility]) 01/06/2019   Severe preeclampsia 01/06/2019   Uterine contractions during pregnancy 01/04/2019   Gestational diabetes 12/02/2018   Increased SMA risk on [place] 10/25/2018   AMA (advanced maternal age) multigravida 35+ 10/11/2018   Late prenatal care affecting pregnancy in second trimester 10/11/2018   History of preterm delivery 10/11/2018   History of VBAC 10/11/2018   Supervision of other normal pregnancy, antepartum 09/27/2018   Bipolar 1 disorder ([facility])         Plan:     Initial labs drawn. Prenatal vitamins. Problem list reviewed and updated. Genetic Screening discussed : panorama ordered.  Ultrasound discussed; fetal survey: results reviewed. Given diagnosis of GHTN, plan for IOL at 37 weeks (orders in Epic) Will schedule glucola testing prior to next appointment. Patient with history of  GDM Patient with history of preeclampsia, will monitor closely. Signs/symptoms reviewed  Follow up in 1 weeks. 50% of 30 min visit spent on counseling and coordination of care.     Versa Craton 04/11/2021

## 2021-04-11 NOTE — Patient Instructions (Signed)
Third Trimester of Pregnancy The third trimester of pregnancy is from week 28 through week 32. This is months 7 through 9. The third trimester is a time when the unborn baby (fetus) is growing rapidly. At the end of the ninth month, the fetus is about 20 inches long and weighs 6-10 pounds. Body changes during your third trimester During the third trimester, your body will continue to go through many changes. The changes vary and generally return to normal after your baby is born. Physical changes Your weight will continue to increase. You can expect to gain 25-35 pounds (11-16 kg) by the end of the pregnancy if you begin pregnancy at a normal weight. If you are underweight, you can expect to gain 28-40 lb (about 13-18 kg), and if you are overweight, you can expect to gain 15-25 lb (about 7-11 kg). You may begin to get stretch marks on your hips, abdomen, and breasts. Your breasts will continue to grow and may hurt. A yellow fluid (colostrum) may leak from your breasts. This is the first milk you are producing for your baby. You may have changes in your hair. These can include thickening of your hair, rapid growth, and changes in texture. Some people also have hair loss during or after pregnancy, or hair that feels dry or thin. Your belly button may stick out. You may notice more swelling in your hands, face, or ankles. Health changes You may have heartburn. You may have constipation. You may develop hemorrhoids. You may develop swollen, bulging veins (varicose veins) in your legs. You may have increased body aches in the pelvis, back, or thighs. This is due to weight gain and increased hormones that are relaxing your joints. You may have increased tingling or numbness in your hands, arms, and legs. The skin on your abdomen may also feel numb. You may feel short of breath because of your expanding uterus. Other changes You may urinate more often because the fetus is moving lower into your pelvis  and pressing on your bladder. You may have more problems sleeping. This may be caused by the size of your abdomen, an increased need to urinate, and an increase in your body's metabolism. You may notice the fetus "dropping," or moving lower in your abdomen (lightening). You may have increased vaginal discharge. You may notice that you have pain around your pelvic bone as your uterus distends. Follow these instructions at home: Medicines Follow your health care provider's instructions regarding medicine use. Specific medicines may be either safe or unsafe to take during pregnancy. Do not take any medicines unless approved by your health care provider. Take a prenatal vitamin that contains at least 600 micrograms (mcg) of folic acid. Eating and drinking Eat a healthy diet that includes fresh fruits and vegetables, whole grains, good sources of protein such as meat, eggs, or tofu, and low-fat dairy products. Avoid raw meat and unpasteurized juice, milk, and cheese. These carry germs that can harm you and your baby. Eat 4 or 5 small meals rather than 3 large meals a day. You may need to take these actions to prevent or treat constipation: Drink enough fluid to keep your urine pale yellow. Eat foods that are high in fiber, such as beans, whole grains, and fresh fruits and vegetables. Limit foods that are high in fat and processed sugars, such as fried or sweet foods. Activity Exercise only as directed by your health care provider. Most people can continue their usual exercise routine during pregnancy. Try to  exercise for 30 minutes at least 5 days a week. Stop exercising if you experience contractions in the uterus. °Stop exercising if you develop pain or cramping in the lower abdomen or lower back. °Avoid heavy lifting. °Do not exercise if it is very hot or humid or if you are at a high altitude. °If you choose to, you may continue to have sex unless your health care provider tells you not  to. °Relieving pain and discomfort °Take frequent breaks and rest with your legs raised (elevated) if you have leg cramps or low back pain. °Take warm sitz baths to soothe any pain or discomfort caused by hemorrhoids. Use hemorrhoid cream if your health care provider approves. °Wear a supportive bra to prevent discomfort from breast tenderness. °If you develop varicose veins: °Wear support hose as told by your health care provider. °Elevate your feet for 15 minutes, 3-4 times a day. °Limit salt in your diet. °Safety °Talk to your health care provider before traveling far distances. °Do not use hot tubs, steam rooms, or saunas. °Wear your seat belt at all times when driving or riding in a car. °Talk with your health care provider if someone is verbally or physically abusive to you. °Preparing for birth °To prepare for the arrival of your baby: °Take prenatal classes to understand, practice, and ask questions about labor and delivery. °Visit the hospital and tour the maternity area. °Purchase a rear-facing car seat and make sure you know how to install it in your car. °Prepare the baby's room or sleeping area. Make sure to remove all pillows and stuffed animals from the baby's crib to prevent suffocation. °General instructions °Avoid cat litter boxes and soil used by cats. These carry germs that can cause birth defects in the baby. If you have a cat, ask someone to clean the litter box for you. °Do not douche or use tampons. Do not use scented sanitary pads. °Do not use any products that contain nicotine or tobacco, such as cigarettes, e-cigarettes, and chewing tobacco. If you need help quitting, ask your health care provider. °Do not use any herbal remedies, illegal drugs, or medicines that were not prescribed to you. Chemicals in these products can harm your baby. °Do not drink alcohol. °You will have more frequent prenatal exams during the third trimester. During a routine prenatal visit, your health care provider  will do a physical exam, perform tests, and discuss your overall health. Keep all follow-up visits. This is important. °Where to find more information °American Pregnancy Association: americanpregnancy.org °American College of Obstetricians and Gynecologists: acog.org/en/Womens%20Health/Pregnancy °Office on Women's Health: womenshealth.gov/pregnancy °Contact a health care provider if you have: °A fever. °Mild pelvic cramps, pelvic pressure, or nagging pain in your abdominal area or lower back. °Vomiting or diarrhea. °Bad-smelling vaginal discharge or foul-smelling urine. °Pain when you urinate. °A headache that does not go away when you take medicine. °Visual changes or see spots in front of your eyes. °Get help right away if: °Your water breaks. °You have regular contractions less than 5 minutes apart. °You have spotting or bleeding from your vagina. °You have severe abdominal pain. °You have difficulty breathing. °You have chest pain. °You have fainting spells. °You have not felt your baby move for the time period told by your health care provider. °You have new or increased pain, swelling, or redness in an arm or leg. °Summary °The third trimester of pregnancy is from week 28 through week 40 (months 7 through 9). °You may have more problems sleeping.   This can be caused by the size of your abdomen, an increased need to urinate, and an increase in your body's metabolism. You will have more frequent prenatal exams during the third trimester. Keep all follow-up visits. This is important. This information is not intended to replace advice given to you by your health care provider. Make sure you discuss any questions you have with your health care provider. Document Revised: 08/10/2019 Document Reviewed: 06/16/2019 Elsevier Patient Education  2022 [employer] Inc[employer]  Contraception Choices Contraception, also called birth control, refers to methods or devices that prevent pregnancy. Hormonal methods Contraceptive  implant A contraceptive implant is a thin, plastic tube that contains a hormone that prevents pregnancy. It is different from an intrauterine device (IUD). It is inserted into the upper part of the arm by a health care provider. Implants can be effective for up to 3 years. Progestin-only injections Progestin-only injections are injections of progestin, a synthetic form of the hormone progesterone. They are given every 3 months by a health care provider. Birth control pills Birth control pills are pills that contain hormones that prevent pregnancy. They must be taken once a day, preferably at the same time each day. A prescription is needed to use this method of contraception. Birth control patch The birth control patch contains hormones that prevent pregnancy. It is placed on the skin and must be changed once a week for three weeks and removed on the fourth week. A prescription is needed to use this method of contraception. Vaginal ring A vaginal ring contains hormones that prevent pregnancy. It is placed in the vagina for three weeks and removed on the fourth week. After that, the process is repeated with a new ring. A prescription is needed to use this method of contraception. Emergency contraceptive Emergency contraceptives prevent pregnancy after unprotected sex. They come in pill form and can be taken up to 5 days after sex. They work best the sooner they are taken after having sex. Most emergency contraceptives are available without a prescription. This method should not be used as your only form of birth control. Barrier methods Female condom A female condom is a thin sheath that is worn over the penis during sex. Condoms keep sperm from going inside a woman's body. They can be used with a sperm-killing substance (spermicide) to increase their effectiveness. They should be thrown away after one use. Female condom A female condom is a soft, loose-fitting sheath that is put into the vagina before  sex. The condom keeps sperm from going inside a woman's body. They should be thrown away after one use. Diaphragm A diaphragm is a soft, dome-shaped barrier. It is inserted into the vagina before sex, along with a spermicide. The diaphragm blocks sperm from entering the uterus, and the spermicide kills sperm. A diaphragm should be left in the vagina for 6-8 hours after sex and removed within 24 hours. A diaphragm is prescribed and fitted by a health care provider. A diaphragm should be replaced every 1-2 years, after giving birth, after gaining more than 15 lb (6.8 kg), and after pelvic surgery. Cervical cap A cervical cap is a round, soft latex or plastic cup that fits over the cervix. It is inserted into the vagina before sex, along with spermicide. It blocks sperm from entering the uterus. The cap should be left in place for 6-8 hours after sex and removed within 48 hours. A cervical cap must be prescribed and fitted by a health care provider. It should be replaced   every 2 years. Sponge A sponge is a soft, circular piece of polyurethane foam with spermicide in it. The sponge helps block sperm from entering the uterus, and the spermicide kills sperm. To use it, you make it wet and then insert it into the vagina. It should be inserted before sex, left in for at least 6 hours after sex, and removed and thrown away within 30 hours. Spermicides Spermicides are chemicals that kill or block sperm from entering the cervix and uterus. They can come as a cream, jelly, suppository, foam, or tablet. A spermicide should be inserted into the vagina with an applicator at least 10-15 minutes before sex to allow time for it to work. The process must be repeated every time you have sex. Spermicides do not require a prescription. Intrauterine contraception Intrauterine device (IUD) An IUD is a T-shaped device that is put in a woman's uterus. There are two types: Hormone IUD.This type contains progestin, a synthetic  form of the hormone progesterone. This type can stay in place for 3-5 years. Copper IUD.This type is wrapped in copper wire. It can stay in place for 10 years. Permanent methods of contraception Female tubal ligation In this method, a woman's fallopian tubes are sealed, tied, or blocked during surgery to prevent eggs from traveling to the uterus. Hysteroscopic sterilization In this method, a small, flexible insert is placed into each fallopian tube. The inserts cause scar tissue to form in the fallopian tubes and block them, so sperm cannot reach an egg. The procedure takes about 3 months to be effective. Another form of birth control must be used during those 3 months. Female sterilization This is a procedure to tie off the tubes that carry sperm (vasectomy). After the procedure, the man can still ejaculate fluid (semen). Another form of birth control must be used for 3 months after the procedure. Natural planning methods Natural family planning In this method, a couple does not have sex on days when the woman could become pregnant. Calendar method In this method, the woman keeps track of the length of each menstrual cycle, identifies the days when pregnancy can happen, and does not have sex on those days. Ovulation method In this method, a couple avoids sex during ovulation. Symptothermal method This method involves not having sex during ovulation. The woman typically checks for ovulation by watching changes in her temperature and in the consistency of cervical mucus. Post-ovulation method In this method, a couple waits to have sex until after ovulation. Where to find more information Centers for Disease Control and Prevention: [employer]Ex[employer].com.br Summary Contraception, also called birth control, refers to methods or devices that prevent pregnancy. Hormonal methods of contraception include implants, injections, pills, patches, vaginal rings, and emergency contraceptives. Barrier methods of  contraception can include female condoms, female condoms, diaphragms, cervical caps, sponges, and spermicides. There are two types of IUDs (intrauterine devices). An IUD can be put in a woman's uterus to prevent pregnancy for 3-5 years. Permanent sterilization can be done through a procedure for males and females. Natural family planning methods involve nothaving sex on days when the woman could become pregnant. This information is not intended to replace advice given to you by your health care provider. Make sure you discuss any questions you have with your health care provider. Document Revised: 08/08/2019 Document Reviewed: 08/08/2019 Elsevier Patient Education  2022 [employer].

## 2021-04-11 NOTE — Progress Notes (Signed)
NOB in office, no previous prenatal care. Pt reports fetal movement with some irritability today.

## 2021-04-15 ENCOUNTER — Telehealth (HOSPITAL_COMMUNITY): Payer: Self-pay | Admitting: *Deleted

## 2021-04-15 ENCOUNTER — Encounter (HOSPITAL_COMMUNITY): Payer: Self-pay | Admitting: *Deleted

## 2021-04-15 ENCOUNTER — Other Ambulatory Visit: Payer: Medicaid Other

## 2021-04-15 ENCOUNTER — Other Ambulatory Visit: Payer: Self-pay

## 2021-04-15 LAB — CYTOLOGY - PAP
Comment: NEGATIVE
Diagnosis: NEGATIVE
High risk HPV: NEGATIVE

## 2021-04-15 NOTE — Telephone Encounter (Signed)
Preadmission screen  

## 2021-04-16 LAB — ABO AND RH: Rh Factor: POSITIVE

## 2021-04-16 LAB — ANTIBODY SCREEN: Antibody Screen: NEGATIVE

## 2021-04-18 ENCOUNTER — Other Ambulatory Visit: Payer: Self-pay

## 2021-04-18 ENCOUNTER — Encounter: Payer: Self-pay | Admitting: Obstetrics and Gynecology

## 2021-04-18 ENCOUNTER — Other Ambulatory Visit (INDEPENDENT_AMBULATORY_CARE_PROVIDER_SITE_OTHER): Payer: Medicaid Other

## 2021-04-18 DIAGNOSIS — Z131 Encounter for screening for diabetes mellitus: Secondary | ICD-10-CM

## 2021-04-18 DIAGNOSIS — O09899 Supervision of other high risk pregnancies, unspecified trimester: Secondary | ICD-10-CM

## 2021-04-18 DIAGNOSIS — Z3A35 35 weeks gestation of pregnancy: Secondary | ICD-10-CM

## 2021-04-18 DIAGNOSIS — Z3483 Encounter for supervision of other normal pregnancy, third trimester: Secondary | ICD-10-CM | POA: Diagnosis not present

## 2021-04-18 LAB — GLUCOSE, POCT (MANUAL RESULT ENTRY)
POC Glucose: 121 mg/dl — AB (ref 70–99)
POC Glucose: 156 mg/dl — AB (ref 70–99)

## 2021-04-18 LAB — POCT CBG (FASTING - GLUCOSE)-MANUAL ENTRY: Glucose Fasting, POC: 83 mg/dL (ref 70–99)

## 2021-04-22 ENCOUNTER — Encounter: Payer: Self-pay | Admitting: Obstetrics and Gynecology

## 2021-04-23 ENCOUNTER — Encounter: Payer: Medicaid Other | Admitting: Obstetrics & Gynecology

## 2021-04-23 ENCOUNTER — Inpatient Hospital Stay (HOSPITAL_COMMUNITY): Payer: Medicaid Other | Admitting: Anesthesiology

## 2021-04-23 ENCOUNTER — Encounter (HOSPITAL_COMMUNITY): Payer: Self-pay | Admitting: Family Medicine

## 2021-04-23 ENCOUNTER — Inpatient Hospital Stay (HOSPITAL_COMMUNITY)
Admission: AD | Admit: 2021-04-23 | Discharge: 2021-04-25 | DRG: 806 | Disposition: A | Discharge status: Home / Self Care | Payer: Medicaid Other | Attending: Obstetrics and Gynecology | Admitting: Obstetrics and Gynecology

## 2021-04-23 ENCOUNTER — Other Ambulatory Visit: Payer: Self-pay

## 2021-04-23 DIAGNOSIS — F17210 Nicotine dependence, cigarettes, uncomplicated: Secondary | ICD-10-CM | POA: Diagnosis present

## 2021-04-23 DIAGNOSIS — O34219 Maternal care for unspecified type scar from previous cesarean delivery: Secondary | ICD-10-CM | POA: Diagnosis present

## 2021-04-23 DIAGNOSIS — O99334 Smoking (tobacco) complicating childbirth: Secondary | ICD-10-CM | POA: Diagnosis present

## 2021-04-23 DIAGNOSIS — D6959 Other secondary thrombocytopenia: Secondary | ICD-10-CM | POA: Diagnosis present

## 2021-04-23 DIAGNOSIS — Z20822 Contact with and (suspected) exposure to covid-19: Secondary | ICD-10-CM | POA: Diagnosis present

## 2021-04-23 DIAGNOSIS — D696 Thrombocytopenia, unspecified: Secondary | ICD-10-CM | POA: Diagnosis present

## 2021-04-23 DIAGNOSIS — O42913 Preterm premature rupture of membranes, unspecified as to length of time between rupture and onset of labor, third trimester: Secondary | ICD-10-CM | POA: Diagnosis present

## 2021-04-23 DIAGNOSIS — O26893 Other specified pregnancy related conditions, third trimester: Secondary | ICD-10-CM | POA: Diagnosis present

## 2021-04-23 DIAGNOSIS — O99324 Drug use complicating childbirth: Secondary | ICD-10-CM | POA: Diagnosis present

## 2021-04-23 DIAGNOSIS — O9912 Other diseases of the blood and blood-forming organs and certain disorders involving the immune mechanism complicating childbirth: Secondary | ICD-10-CM | POA: Diagnosis present

## 2021-04-23 DIAGNOSIS — O134 Gestational [pregnancy-induced] hypertension without significant proteinuria, complicating childbirth: Principal | ICD-10-CM | POA: Diagnosis present

## 2021-04-23 DIAGNOSIS — O09529 Supervision of elderly multigravida, unspecified trimester: Secondary | ICD-10-CM

## 2021-04-23 DIAGNOSIS — O139 Gestational [pregnancy-induced] hypertension without significant proteinuria, unspecified trimester: Secondary | ICD-10-CM | POA: Diagnosis present

## 2021-04-23 DIAGNOSIS — Z98891 History of uterine scar from previous surgery: Secondary | ICD-10-CM

## 2021-04-23 DIAGNOSIS — F1290 Cannabis use, unspecified, uncomplicated: Secondary | ICD-10-CM | POA: Diagnosis present

## 2021-04-23 DIAGNOSIS — O0932 Supervision of pregnancy with insufficient antenatal care, second trimester: Secondary | ICD-10-CM

## 2021-04-23 DIAGNOSIS — Z3A36 36 weeks gestation of pregnancy: Secondary | ICD-10-CM

## 2021-04-23 DIAGNOSIS — F319 Bipolar disorder, unspecified: Secondary | ICD-10-CM | POA: Diagnosis present

## 2021-04-23 DIAGNOSIS — O133 Gestational [pregnancy-induced] hypertension without significant proteinuria, third trimester: Secondary | ICD-10-CM

## 2021-04-23 DIAGNOSIS — O34211 Maternal care for low transverse scar from previous cesarean delivery: Secondary | ICD-10-CM | POA: Diagnosis not present

## 2021-04-23 LAB — CBC
HCT: 36 % (ref 36.0–46.0)
Hemoglobin: 11.6 g/dL — ABNORMAL LOW (ref 12.0–15.0)
MCH: 30.9 pg (ref 26.0–34.0)
MCHC: 32.2 g/dL (ref 30.0–36.0)
MCV: 95.7 fL (ref 80.0–100.0)
Platelets: 136 10*3/uL — ABNORMAL LOW (ref 150–400)
RBC: 3.76 MIL/uL — ABNORMAL LOW (ref 3.87–5.11)
RDW: 13.6 % (ref 11.5–15.5)
WBC: 12.1 10*3/uL — ABNORMAL HIGH (ref 4.0–10.5)
nRBC: 0.2 % (ref 0.0–0.2)

## 2021-04-23 LAB — RESP PANEL BY RT-PCR (FLU A&B, COVID) ARPGX2
Influenza A by PCR: NEGATIVE
Influenza B by PCR: NEGATIVE
SARS Coronavirus 2 by RT PCR: NEGATIVE

## 2021-04-23 LAB — TYPE AND SCREEN
ABO/RH(D): O POS
Antibody Screen: NEGATIVE

## 2021-04-23 LAB — PROTEIN / CREATININE RATIO, URINE
Creatinine, Urine: 42.75 mg/dL
Total Protein, Urine: <6 mg/dL

## 2021-04-23 LAB — POCT FERN TEST: POCT Fern Test: POSITIVE

## 2021-04-23 MED ORDER — LIDOCAINE-EPINEPHRINE (PF) 2 %-1:200000 IJ SOLN
INTRAMUSCULAR | Status: DC | PRN
  Administered 2021-04-23: 12 mL via EPIDURAL

## 2021-04-23 MED ORDER — DIBUCAINE (PERIANAL) 1 % EX OINT: 1.0000 "application " | TOPICAL_OINTMENT | CUTANEOUS | Status: DC | PRN

## 2021-04-23 MED ORDER — ONDANSETRON HCL 4 MG PO TABS: 4.0000 mg | ORAL_TABLET | ORAL | Status: DC | PRN

## 2021-04-23 MED ORDER — PHENYLEPHRINE 40 MCG/ML (10ML) SYRINGE FOR IV PUSH (FOR BLOOD PRESSURE SUPPORT)
80.0000 ug | PREFILLED_SYRINGE | INTRAVENOUS | Status: DC | PRN
  Filled 2021-04-23: qty 10

## 2021-04-23 MED ORDER — ACETAMINOPHEN 325 MG PO TABS: 650.0000 mg | ORAL_TABLET | ORAL | Status: DC | PRN

## 2021-04-23 MED ORDER — DIPHENHYDRAMINE HCL 25 MG PO CAPS: 25.0000 mg | ORAL_CAPSULE | Freq: Four times a day (QID) | ORAL | Status: DC | PRN

## 2021-04-23 MED ORDER — IBUPROFEN 600 MG PO TABS
600.0000 mg | ORAL_TABLET | Freq: Four times a day (QID) | ORAL | Status: DC
  Administered 2021-04-24 (×5): 600 mg via ORAL
  Filled 2021-04-23 (×7): qty 1

## 2021-04-23 MED ORDER — OXYCODONE-ACETAMINOPHEN 5-325 MG PO TABS: 2.0000 | ORAL_TABLET | ORAL | Status: DC | PRN

## 2021-04-23 MED ORDER — OXYCODONE HCL 5 MG PO TABS: 5.0000 mg | ORAL_TABLET | ORAL | Status: DC | PRN

## 2021-04-23 MED ORDER — EPHEDRINE 5 MG/ML INJ: 10.0000 mg | INTRAVENOUS | Status: DC | PRN

## 2021-04-23 MED ORDER — PRENATAL MULTIVITAMIN CH
1.0000 | ORAL_TABLET | Freq: Every day | ORAL | Status: DC
  Administered 2021-04-24: 1 via ORAL
  Filled 2021-04-23 (×2): qty 1

## 2021-04-23 MED ORDER — OXYTOCIN-SODIUM CHLORIDE 30-0.9 UT/500ML-% IV SOLN
2.5000 [IU]/h | INTRAVENOUS | Status: DC
  Administered 2021-04-23: 2.5 [IU]/h via INTRAVENOUS
  Filled 2021-04-23: qty 500

## 2021-04-23 MED ORDER — WITCH HAZEL-GLYCERIN EX PADS: 1.0000 "application " | MEDICATED_PAD | CUTANEOUS | Status: DC | PRN

## 2021-04-23 MED ORDER — LACTATED RINGERS IV SOLN: 500.0000 mL | Freq: Once | INTRAVENOUS | Status: DC

## 2021-04-23 MED ORDER — ZOLPIDEM TARTRATE 5 MG PO TABS: 5.0000 mg | ORAL_TABLET | Freq: Every evening | ORAL | Status: DC | PRN

## 2021-04-23 MED ORDER — BENZOCAINE-MENTHOL 20-0.5 % EX AERO: 1.0000 "application " | INHALATION_SPRAY | CUTANEOUS | Status: DC | PRN

## 2021-04-23 MED ORDER — OXYCODONE-ACETAMINOPHEN 5-325 MG PO TABS: 1.0000 | ORAL_TABLET | ORAL | Status: DC | PRN

## 2021-04-23 MED ORDER — SIMETHICONE 80 MG PO CHEW: 80.0000 mg | CHEWABLE_TABLET | ORAL | Status: DC | PRN

## 2021-04-23 MED ORDER — ONDANSETRON HCL 4 MG/2ML IJ SOLN: 4.0000 mg | Freq: Four times a day (QID) | INTRAMUSCULAR | Status: DC | PRN

## 2021-04-23 MED ORDER — FENTANYL CITRATE (PF) 100 MCG/2ML IJ SOLN
50.0000 ug | INTRAMUSCULAR | Status: DC | PRN
  Administered 2021-04-23 (×2): 100 ug via INTRAVENOUS
  Filled 2021-04-23 (×2): qty 2

## 2021-04-23 MED ORDER — PHENYLEPHRINE 40 MCG/ML (10ML) SYRINGE FOR IV PUSH (FOR BLOOD PRESSURE SUPPORT): 80.0000 ug | PREFILLED_SYRINGE | INTRAVENOUS | Status: DC | PRN

## 2021-04-23 MED ORDER — LACTATED RINGERS IV SOLN: 500.0000 mL | INTRAVENOUS | Status: DC | PRN

## 2021-04-23 MED ORDER — FENTANYL-BUPIVACAINE-NACL 0.5-0.125-0.9 MG/250ML-% EP SOLN
12.0000 mL/h | EPIDURAL | Status: DC | PRN
  Filled 2021-04-23: qty 250

## 2021-04-23 MED ORDER — OXYTOCIN BOLUS FROM INFUSION
333.0000 mL | Freq: Once | INTRAVENOUS | Status: AC
  Administered 2021-04-23: 333 mL via INTRAVENOUS

## 2021-04-23 MED ORDER — LIDOCAINE HCL (PF) 1 % IJ SOLN: 30.0000 mL | INTRAMUSCULAR | Status: DC | PRN

## 2021-04-23 MED ORDER — LIDOCAINE HCL (PF) 1 % IJ SOLN
INTRAMUSCULAR | Status: DC | PRN
  Administered 2021-04-23: 6 mL via EPIDURAL

## 2021-04-23 MED ORDER — SOD CITRATE-CITRIC ACID 500-334 MG/5ML PO SOLN: 30.0000 mL | ORAL | Status: DC | PRN

## 2021-04-23 MED ORDER — COCONUT OIL OIL: 1.0000 "application " | TOPICAL_OIL | Status: DC | PRN

## 2021-04-23 MED ORDER — TETANUS-DIPHTH-ACELL PERTUSSIS 5-2.5-18.5 LF-MCG/0.5 IM SUSY: 0.5000 mL | PREFILLED_SYRINGE | Freq: Once | INTRAMUSCULAR | Status: DC

## 2021-04-23 MED ORDER — ONDANSETRON HCL 4 MG/2ML IJ SOLN: 4.0000 mg | INTRAMUSCULAR | Status: DC | PRN

## 2021-04-23 MED ORDER — LACTATED RINGERS IV SOLN: INTRAVENOUS | Status: DC

## 2021-04-23 MED ORDER — DIPHENHYDRAMINE HCL 50 MG/ML IJ SOLN: 12.5000 mg | INTRAMUSCULAR | Status: DC | PRN

## 2021-04-23 MED ORDER — SENNOSIDES-DOCUSATE SODIUM 8.6-50 MG PO TABS
2.0000 | ORAL_TABLET | Freq: Every day | ORAL | Status: DC
  Administered 2021-04-25: 2 via ORAL
  Filled 2021-04-23 (×2): qty 2

## 2021-04-23 NOTE — H&P (Signed)
OBSTETRIC ADMISSION HISTORY AND PHYSICAL  Amber [name] is a 41 y.o. female [id number] with IUP at 70w2dby [place] for SOL/SROM   Patient reports contractions began last night, estimates ROM at 1615, confirmed at [facility]. She reports +FMs, no VB, no blurry vision, headaches or peripheral edema, and RUQ pain.  She plans on bottle feeding. She is undecided on birth control at the moment. Not interested in any implants such as IUD or Nexplanon. Previously on Depo Provera, and contemplating resuming that or OCPs. She did not receive much prenatal care. Only 1 obstetrical visit after initial presentation to [facility] on 04/08/2021.  Dating: By [place]--->  Estimated Date of Delivery: 05/19/21  Sono: @[redacted]w[redacted]d , normal anatomy, cephalic presentation, 27035K 46% EFW   Prenatal History/Complications:  Gestational HTN History of GDM with most recent A1C 5.7%  Past Medical History: Past Medical History:  Diagnosis Date   Anemia    Anxiety    Bipolar 1 disorder ([place]    Depression    doing fine   Gestational diabetes 12/02/2018   Gestational diabetes 12/02/2018   1 hr was 183 Diet controlled   Headache    Pregnancy induced hypertension     Past Surgical History: Past Surgical History:  Procedure Laterality Date   CESAREAN SECTION      Obstetrical History: OB History     Gravida  6   Para  4   Term  3   Preterm  1   AB  1   Living  4      SAB  0   IAB  0   Ectopic  1   Multiple  0   Live Births  4           Social History Social History   Socioeconomic History   Marital status: Single    Spouse name: Not on file   Number of children: Not on file   Years of education: Not on file   Highest education level: Not on file  Occupational History   Not on file  Tobacco Use   Smoking status: Every Day    Packs/day: 0.50    Years: 21.00    Pack years: 10.50    Types: Cigarettes   Smokeless tobacco: Never  Vaping Use   Vaping Use: Never used  Substance and Sexual  Activity   Alcohol use: Not Currently    Comment: socially   Drug use: No   Sexual activity: Not Currently    Birth control/protection: None  Other Topics Concern   Not on file  Social History Narrative   Not on file   Social Determinants of Health   Financial Resource Strain: Not on file  Food Insecurity: Not on file  Transportation Needs: Not on file  Physical Activity: Not on file  Stress: Not on file  Social Connections: Not on file    Family History: Family History  Problem Relation Age of Onset   Arthritis Mother    Hypertension Mother    Healthy Father    Alcohol abuse Neg Hx    Asthma Neg Hx    Birth defects Neg Hx    Cancer Neg Hx    COPD Neg Hx    Depression Neg Hx    Diabetes Neg Hx    Drug abuse Neg Hx    Early death Neg Hx    Heart disease Neg Hx    Hearing loss Neg Hx    Hyperlipidemia Neg Hx  Kidney disease Neg Hx    Learning disabilities Neg Hx    Mental illness Neg Hx    Mental retardation Neg Hx    Miscarriages / Stillbirths Neg Hx    Stroke Neg Hx    Vision loss Neg Hx    Varicose Veins Neg Hx     Allergies: No Known Allergies  Medications Prior to Admission  Medication Sig Dispense Refill Last Dose   Blood Pressure Monitoring (BLOOD PRESSURE KIT) DEVI 1 kit by Does not apply route as needed. 1 each 0    Misc. Devices (GOJJI WEIGHT SCALE) MISC 1 Device by Does not apply route as needed. 1 each 0    Prenatal Vit-Fe Fumarate-FA (PRENATAL MULTIVITAMIN) TABS Take 1 tablet by mouth daily. (Patient not taking: Reported on 04/11/2021)      Prenatal Vit-Fe Phos-FA-Omega (VITAFOL GUMMIES) 3.33-0.333-34.8 MG CHEW Chew 2 tablets by mouth daily. 60 tablet 6      Review of Systems   All systems reviewed and negative except as stated in HPI  Blood pressure 125/90, pulse (!) 102, temperature 98.2 F (36.8 C), resp. rate 16, height 5' 1"  (1.549 m), weight 64 kg, SpO2 100 %. General appearance: alert, cooperative, and no distress Lungs: clear to  auscultation bilaterally Heart: regular rate and rhythm Abdomen: soft, non-tender, gravid. Pelvic: Deferred. Extremities: No lower extremity edema. Presentation: cephalic Fetal monitoringBaseline: 140 bpm, Variability: Good {> 6 bpm), Accelerations: Reactive, and Decelerations: Absent Uterine activity present. Dilation: 3 Effacement (%): 80 Station: -2 Exam by:: Elray Mcgregor, RN   Prenatal labs: ABO, Rh: --/--/O POS (02/07 1740) Antibody: NEG (02/07 1740) Rubella: 1.13 (01/23 1945) RPR: NON REACTIVE (01/23 1945)  HBsAg: NON REACTIVE (01/23 1945)  HIV: Non Reactive (01/23 1945)  GBS: Negative (01/23 1945) 2 hr Glucola borderline elevated. 1hr = 156, 2hr = 121. HgbA1c = 5.7% Genetic screening  normal. AFP not done due to late prenatal care. Anatomy US normal  Prenatal Transfer Tool  Maternal Diabetes: No Genetic Screening: Normal Maternal Ultrasounds/Referrals: Normal, but limted by advanced gestational age. Fetal Ultrasounds or other Referrals:  None Maternal Substance Abuse:  Yes:  Type: Marijuana Significant Maternal Medications:  None Significant Maternal Lab Results: Group B Strep negative  Results for orders placed or performed during the hospital encounter of 04/23/21 (from the past 24 hour(s))  Fern Test   Collection Time: 04/23/21  5:29 PM  Result Value Ref Range   POCT Fern Test Positive = ruptured amniotic membanes   Type and screen [place]   Collection Time: 04/23/21  5:40 PM  Result Value Ref Range   ABO/RH(D) O POS    Antibody Screen NEG    Sample Expiration      04/26/2021,2359 Performed at [facility], [place] [address], [place], [place] [address]     Patient Active Problem List   Diagnosis Date Noted   Indication for care in labor and delivery, antepartum 04/23/2021   Supervision of other high risk pregnancy, antepartum 04/11/2021   Gestational hypertension 04/11/2021   Thrombocytopenia affecting pregnancy ([place])  01/06/2019   Severe preeclampsia 01/06/2019   Increased SMA risk on [place] 10/25/2018   AMA (advanced maternal age) multigravida 35+ 10/11/2018   Late prenatal care affecting pregnancy in second trimester 10/11/2018   History of preterm delivery 10/11/2018   History of VBAC 10/11/2018   Supervision of other normal pregnancy, antepartum 09/27/2018   Bipolar 1 disorder (HCC)     Assessment/Plan:  Amber [name] is  a 41 y.o. [id number] at 66w2dby UKoreapresents for SOL/SROM.  #Labor:  #Pain: Epidural planned. #FWB: Cat_ #ID:  GBS Negative #MOF: Planning to breastfeed #MOC: Undecided, considering OCPs or Depo Provera.  RWilhemina Cash [place] 04/23/2021, 7:09 PM  I spoke with and examined patient and agree with resident/PA-S/MS/SNM's note and plan of care.  S: uncomfortable w/ uc's, ROM clear fluid 1600, good fm, no vb O: VSS, mildly elevated bp A: 379w2dROM, early labor, GHTN, limited pnc, prev c/s x 1 then 3 VBACs P: Expectant mangament, GBS neg, monitor bp's/labs, plans epidural, bottlefeeding, depo then interval BTL (no papers) KiRoma SchanzCNM, [facility]/09/2021 8:54 PM

## 2021-04-23 NOTE — Progress Notes (Addendum)
Attempted to call OB Anesthesiologist x3 between 2003 to 2012 after patient request for epidural at 2002. Each call responded with "provider not available"; will attempt to reach                         ANMD in person in department. VD RN, 2013  ANMD located at 2022 after [facility] contacted anesthesiologist after notified of situation; ANMD contacted me for report on patient. Report given and ANMD en route to bedside for epidural placement at 2022. ANMD at bedside at 2029. VD RN

## 2021-04-23 NOTE — Discharge Summary (Signed)
Postpartum Discharge Summary     Patient Name: Amber [name] DOB: [birth year] MRN: [id number]  Date of admission: 04/23/2021 Delivery date:04/23/2021  Delivering provider: Wells Guiles R  Date of discharge: 04/25/2021  Admitting diagnosis: Indication for care in labor and delivery, antepartum [O75.9] Intrauterine pregnancy: [redacted]w[redacted]d    Secondary diagnosis:  Principal Problem:   Indication for care in labor and delivery, antepartum  Additional problems: late care @ 34wks, GHTN, PROM/PTL, gestational thrombocytopenia, bipolar-no meds    Discharge diagnosis: Preterm Pregnancy Delivered and Gestational Hypertension                                              Post partum procedures: none Augmentation:  none Complications: None  Hospital course: Onset of Labor With Vaginal Delivery      41y.o. yo [id number] 360w2das admitted in Latent Labor on 04/23/2021. Patient had an uncomplicated labor course as follows:  Membrane Rupture Time/Date: 4:15 PM ,04/23/2021   Delivery Method:Vaginal, Spontaneous  Episiotomy: None  Lacerations:  None  Patient had a postpartum course remarkable for being started on Procardia XL 305mnd Lasix 67m67mhile an inpatient) due to mild range BP elevations; she was sent home with Procardia.  She is ambulating, tolerating a regular diet, passing flatus, and urinating well. Patient is discharged home in stable condition on 04/25/21. She will have an MD visit in 1-2 wks for BP check as well as for pre-op BTL (needs to sign papers).  Newborn Data: Birth date:04/23/2021  Birth time:9:02 PM  Gender:Female  Living status:Living  Apgars:8 ,9  Weight:2590 g   Magnesium Sulfate received: No BMZ received: No Rhophylac:N/A MMR:No T-DaP:Given prenatally Flu: No Transfusion:No  Physical exam  Vitals:   04/24/21 1215 04/24/21 1935 04/25/21 0516 04/25/21 0917  BP: 124/90 121/88 134/85 113/88  Pulse: 75 (!) 104 77 93  Resp: _0 Temp: 98.5 F (36.9 C) 97.9  F (36.6 C) 97.8 F (36.6 C)   TempSrc: Oral Oral Oral   SpO2: 100% 97% 100%   Weight:      Height:       General: alert and cooperative Lochia: appropriate Uterine Fundus: firm Incision: N/A DVT Evaluation: No evidence of DVT seen on physical exam. Labs: Lab Results  Component Value Date   WBC 12.1 (H) 04/23/2021   HGB 11.6 (L) 04/23/2021   HCT 36.0 04/23/2021   MCV 95.7 04/23/2021   PLT 136 (L) 04/23/2021   CMP Latest Ref Rng & Units 04/23/2021  Glucose 70 - 99 mg/dL 68(L)  BUN 6 - 20 mg/dL 7  Creatinine 0.44 - 1.00 mg/dL 0.82  Sodium 135 - 145 mmol/L 138  Potassium 3.5 - 5.1 mmol/L 4.2  Chloride 98 - 111 mmol/L 105  CO2 22 - 32 mmol/L 23  Calcium 8.9 - 10.3 mg/dL 8.9  Total Protein 6.5 - 8.1 g/dL 5.7(L)  Total Bilirubin 0.3 - 1.2 mg/dL 0.1(L)  Alkaline Phos 38 - 126 U/L 189(H)  AST 15 - 41 U/L 12(L)  ALT 0 - 44 U/L 9   Edinburgh Score: Edinburgh Postnatal Depression Scale Screening Tool 04/24/2021  I have been able to laugh and see the funny side of things. 0  I have looked forward with enjoyment to things. 0  I have blamed myself unnecessarily when things went wrong. 1  I have  been anxious or worried for no good reason. 1  I have felt scared or panicky for no good reason. 0  Things have been getting on top of me. 2  I have been so unhappy that I have had difficulty sleeping. 0  I have felt sad or miserable. 0  I have been so unhappy that I have been crying. 0  The thought of harming myself has occurred to me. 0  Edinburgh Postnatal Depression Scale Total 4     After visit meds:  Allergies as of 04/25/2021   No Known Allergies      Medication List     STOP taking these medications    Blood Pressure Kit Devi   Gojji Weight Scale Misc   Vitafol Gummies 3.33-0.333-34.8 MG Chew       TAKE these medications    acetaminophen 325 MG tablet Commonly known as: Tylenol Take 2 tablets (650 mg total) by mouth every 4 (four) hours as needed (for pain scale  < 4).   Concept OB 130-92.4-1 MG Caps Take 1 capsule by mouth daily.   ibuprofen 600 MG tablet Commonly known as: ADVIL Take 1 tablet (600 mg total) by mouth every 6 (six) hours.   NIFEdipine 30 MG 24 hr tablet Commonly known as: ADALAT CC Take 1 tablet (30 mg total) by mouth daily.         Discharge home in stable condition Infant Feeding: Bottle Infant Disposition:home with mother Discharge instruction: per After Visit Summary and Postpartum booklet. Activity: Advance as tolerated. Pelvic rest for 6 weeks.  Diet: routine diet Future Appointments: Future Appointments  Date Time Provider [place]  05/02/2021  8:50 AM Kerin Perna, NP [facility] None  05/06/2021  1:45 PM Lynnea Ferrier, LCSW [place] None  05/06/2021  2:30 PM Woodroe Mode, MD [place] None  05/23/2021  3:10 PM Griffin Basil, MD [place] None   Follow up Visit: Roma Schanz, CNM  P [facility] Admin Pool Please schedule this patient for PP visit in: mood check 2wks, pp visit 4wks  High risk pregnancy complicated by: GHTN, late care @ 34wks  Delivery mode:  SVD  Anticipated Birth Control:  plan is for depo inpt the interval BTL  PP Procedures needed: BP check  Schedule Integrated BH visit: yes  Provider: MD for pp visit/BTL pre-op   04/25/2021 Myrtis Ser, CNM

## 2021-04-23 NOTE — Anesthesia Procedure Notes (Signed)
Epidural Patient location during procedure: OB Start time: 04/23/2021 8:50 PM End time: 04/23/2021 8:59 PM  Staffing Anesthesiologist: Trevor Iha, MD Performed: anesthesiologist   Preanesthetic Checklist Completed: patient identified, IV checked, site marked, risks and benefits discussed, surgical consent, monitors and equipment checked, pre-op evaluation and timeout performed  Epidural Patient position: sitting Prep: DuraPrep and site prepped and draped Patient monitoring: continuous pulse ox and blood pressure Approach: midline Location: L2-L3 Injection technique: LOR air  Needle:  Needle type: Tuohy  Needle gauge: 17 G Needle length: 9 cm and 9 Needle insertion depth: 7 cm Catheter type: closed end flexible Catheter size: 19 Gauge Catheter at skin depth: 14 cm Test dose: negative  Assessment Events: blood not aspirated, injection not painful, no injection resistance, no paresthesia and negative IV test  Additional Notes Patient identified. Risks/Benefits/Options discussed with patient including but not limited to bleeding, infection, nerve damage, paralysis, failed block, incomplete pain control, headache, blood pressure changes, nausea, vomiting, reactions to medication both or allergic, itching and postpartum back pain. Confirmed with bedside nurse the patient's most recent platelet count. Confirmed with patient that they are not currently taking any anticoagulation, have any bleeding history or any family history of bleeding disorders. Patient expressed understanding and wished to proceed. All questions were answered. Sterile technique was used throughout the entire procedure. Please see nursing notes for vital signs. Test dose was given through epidural needle and negative prior to continuing to dose epidural or start infusion. Warning signs of high block given to the patient including shortness of breath, tingling/numbness in hands, complete motor block, or any concerning  symptoms with instructions to call for help. Patient was given instructions on fall risk and not to get out of bed. All questions and concerns addressed with instructions to call with any issues. 1 Attempt (S) . Patient tolerated procedure well.

## 2021-04-23 NOTE — MAU Note (Signed)
Pt reports ctx's that started last night. At 1615 pt report she felt a pop and saw cloudy fluid. Pt reports she thinks her water broke.   Denies vaginal bleeding.   Reports +FM

## 2021-04-23 NOTE — Anesthesia Preprocedure Evaluation (Signed)
Anesthesia Evaluation  Patient identified by MRN, date of birth, ID band Patient awake    Reviewed: Allergy & Precautions, NPO status , Patient's Chart, lab work & pertinent test results  Airway Mallampati: II  TM Distance: >3 FB Neck ROM: Full    Dental no notable dental hx. (+) Teeth Intact   Pulmonary Current Smoker,    Pulmonary exam normal breath sounds clear to auscultation       Cardiovascular hypertension, Normal cardiovascular exam Rhythm:Regular Rate:Normal     Neuro/Psych  Headaches, PSYCHIATRIC DISORDERS    GI/Hepatic negative GI ROS, Neg liver ROS,   Endo/Other  diabetes  Renal/GU      Musculoskeletal   Abdominal   Peds  Hematology Lab Results      Component                Value               Date                            HGB                      11.6 (L)            04/23/2021                HCT                      36.0                04/23/2021                   PLT                      136 (L)             04/23/2021              Anesthesia Other Findings   Reproductive/Obstetrics (+) Pregnancy                            Anesthesia Physical Anesthesia Plan  ASA: 3  Anesthesia Plan: Epidural   Post-op Pain Management:    Induction:   PONV Risk Score and Plan: Treatment may vary due to age or medical condition  Airway Management Planned: Nasal Cannula  Additional Equipment:   Intra-op Plan:   Post-operative Plan:   Informed Consent: I have reviewed the patients History and Physical, chart, labs and discussed the procedure including the risks, benefits and alternatives for the proposed anesthesia with the patient or authorized representative who has indicated his/her understanding and acceptance.       Plan Discussed with:   Anesthesia Plan Comments: (36.2 Wk G6P4 w Gest Thrombocytopenia for LEA)        Anesthesia Quick Evaluation

## 2021-04-24 ENCOUNTER — Other Ambulatory Visit (HOSPITAL_COMMUNITY): Payer: Self-pay

## 2021-04-24 LAB — COMPREHENSIVE METABOLIC PANEL
ALT: 9 U/L (ref 0–44)
AST: 12 U/L — ABNORMAL LOW (ref 15–41)
Albumin: 2.8 g/dL — ABNORMAL LOW (ref 3.5–5.0)
Alkaline Phosphatase: 189 U/L — ABNORMAL HIGH (ref 38–126)
Anion gap: 10 (ref 5–15)
BUN: 7 mg/dL (ref 6–20)
CO2: 23 mmol/L (ref 22–32)
Calcium: 8.9 mg/dL (ref 8.9–10.3)
Chloride: 105 mmol/L (ref 98–111)
Creatinine, Ser: 0.82 mg/dL (ref 0.44–1.00)
GFR, Estimated: >60 mL/min (ref >60)
Glucose, Bld: 68 mg/dL — ABNORMAL LOW (ref 70–99)
Potassium: 4.2 mmol/L (ref 3.5–5.1)
Sodium: 138 mmol/L (ref 135–145)
Total Bilirubin: 0.1 mg/dL — ABNORMAL LOW (ref 0.3–1.2)
Total Protein: 5.7 g/dL — ABNORMAL LOW (ref 6.5–8.1)

## 2021-04-24 LAB — RPR: RPR Ser Ql: NONREACTIVE

## 2021-04-24 MED ORDER — NIFEDIPINE ER OSMOTIC RELEASE 30 MG PO TB24
30.0000 mg | ORAL_TABLET | Freq: Every day | ORAL | Status: DC
  Administered 2021-04-24 – 2021-04-25 (×2): 30 mg via ORAL
  Filled 2021-04-24 (×2): qty 1

## 2021-04-24 MED ORDER — ACETAMINOPHEN 325 MG PO TABS
650.0000 mg | ORAL_TABLET | ORAL | 0 refills | Status: DC | PRN
  Filled 2021-04-24: qty 30, 3d supply, fill #0

## 2021-04-24 MED ORDER — FUROSEMIDE 20 MG PO TABS
20.0000 mg | ORAL_TABLET | Freq: Every day | ORAL | Status: DC
  Administered 2021-04-24 – 2021-04-25 (×2): 20 mg via ORAL
  Filled 2021-04-24 (×2): qty 1

## 2021-04-24 MED ORDER — NIFEDIPINE ER 30 MG PO TB24
30.0000 mg | ORAL_TABLET | Freq: Every day | ORAL | 2 refills | Status: DC
  Filled 2021-04-24: qty 30, 30d supply, fill #0

## 2021-04-24 MED ORDER — CONCEPT OB 130-92.4-1 MG PO CAPS: 1.0000 | ORAL_CAPSULE | Freq: Every day | ORAL | 5 refills | Status: DC

## 2021-04-24 MED ORDER — IBUPROFEN 600 MG PO TABS
600.0000 mg | ORAL_TABLET | Freq: Four times a day (QID) | ORAL | 0 refills | Status: DC
  Filled 2021-04-24: qty 30, 8d supply, fill #0

## 2021-04-24 NOTE — Progress Notes (Signed)
Post Partum Day 1  Subjective: no complaints, up ad lib, voiding, tolerating PO, and + flatus  Objective: Blood pressure 138/73, pulse 79, temperature 98.2 F (36.8 C), temperature source Oral, resp. rate 18, height 5\' 1"  (1.549 m), weight 64 kg, SpO2 100 %, unknown if currently breastfeeding.  Physical Exam:  General: alert, cooperative, and no distress Lochia: appropriate Uterine Fundus: firm Incision: n/a DVT Evaluation: No evidence of DVT seen on physical exam.  Recent Labs    04/23/21 1800  HGB 11.6*  HCT 36.0    Assessment/Plan: Plan for discharge tomorrow and Contraception Depo outpatient, consider interval tubal   LOS: 1 day   06/21/21 04/24/2021, 10:44 AM

## 2021-04-24 NOTE — Anesthesia Postprocedure Evaluation (Signed)
Anesthesia Post Note  Patient: Gillermo Murdoch  Procedure(s) Performed: AN AD HOC LABOR EPIDURAL     Patient location during evaluation: Mother Baby Anesthesia Type: Epidural Level of consciousness: awake and alert Pain management: pain level controlled Vital Signs Assessment: post-procedure vital signs reviewed and stable Respiratory status: spontaneous breathing, nonlabored ventilation and respiratory function stable Cardiovascular status: stable Postop Assessment: no headache, no backache and epidural receding Anesthetic complications: no   No notable events documented.  Last Vitals:  Vitals:   04/24/21 0815 04/24/21 1215  BP: 138/73 124/90  Pulse: 79 75  Resp: 18 18  Temp: 36.8 C 36.9 C  SpO2: 100% 100%    Last Pain:  Vitals:   04/24/21 1500  TempSrc:   PainSc: 0-No pain   Pain Goal:                   Trevor Iha

## 2021-04-24 NOTE — Progress Notes (Signed)
POSTPARTUM PROGRESS NOTE  Post Partum Day 1  Subjective:  Amber [name] is a 41 y.o. [id number] s/p VBAC at [redacted]w[redacted]d.  No acute events overnight.  Pt denies problems with ambulating, voiding or po intake.  She denies nausea or vomiting.  Pain is well controlled.  She has had flatus. She has not had bowel movement.  Lochia Minimal.   Objective: Blood pressure 125/83, pulse 87, temperature 98.3 F (36.8 C), temperature source Oral, resp. rate 18, height 5\' 1"  (1.549 m), weight 64 kg, SpO2 100 %, unknown if currently breastfeeding.  Physical Exam:  General: alert, cooperative and no distress Chest: no respiratory distress Heart:regular rate, distal pulses intact Abdomen: soft, nontender,  Uterine Fundus: firm, appropriately tender DVT Evaluation: No calf swelling or tenderness Extremities: No pitting edema Skin: warm, dry  Recent Labs    04/23/21 1800  HGB 11.6*  HCT 36.0    Assessment/Plan: Amber [name] is a 41 y.o. 24 s/p VBAC at [redacted]w[redacted]d   PPD#1 - Doing well Gestational HTN: Procardia 30mg , Lasix 20mg  Contraception: Depo, interval BTL Feeding: Formula Dispo: Plan for discharge 2/9.   LOS: 1 day   , Medical Student 04/24/2021, 7:30 AM

## 2021-04-24 NOTE — Clinical Social Work Maternal (Signed)
°CLINICAL SOCIAL WORK MATERNAL/CHILD NOTE ° °Patient Details  °Name: Amber [name] °MRN: [id number] °Date of Birth: 06/17/1980 ° °Date:  04/24/2021 ° °Clinical Social Worker Initiating Note:  Amber [name], LCSWA Date/Time: Initiated:  04/24/21/1025    ° °Child's Name:  Amber [name]  ° °Biological Parents:  Mother, Father (Amber [name] 9/4/1980s)  ° °Need for Interpreter:  None  ° °Reason for Referral:  Late or No Prenatal Care  , Behavioral Health Concerns, Current Substance Use/Substance Use During Pregnancy    ° °Address:  [address] °[place] [address]  °  °Phone number:  [phone] (home)    ° °Additional phone number:  ° °Household Members/Support Persons (HM/SP):   Household Member/Support Person 1, Household Member/Support Person 2, Household Member/Support Person 3, Household Member/Support Person 4, Household Member/Support Person 5 ° ° °HM/SP Name Relationship DOB or Age  °HM/SP -1 Amber [name] Father 08/29/1949  °HM/SP -2 Amber [name] Son 01/05/2019  °HM/SP -3 Amber [name] Daughter 07/27/2013  °HM/SP -4 Amber [name] Daughter 10/23/2011  °HM/SP -5 Amber [name] Son 09/10/2009  °HM/SP -6        °HM/SP -7        °HM/SP -8        ° ° °Natural Supports (not living in the home):  Immediate Family, Parent, Friends  ° °Professional Supports: None  ° °Employment: Full-time  ° °Type of Work: [employer]  ° °Education:  9 to 11 years (10th grade)  ° °Homebound arranged:   ° °Financial Resources:  Medicaid  ° °Other Resources:  WIC, Food Stamps    ° °Cultural/Religious Considerations Which May Impact Care:   ° °Strengths:  Ability to meet basic needs  , Home prepared for child  , Pediatrician chosen  ° °Psychotropic Medications:        ° °Pediatrician:     area ° °Pediatrician List:  ° ° [facility]  °[place]    ° County    °[place]    °[place]    °[place]    ° ° °Pediatrician Fax Number:   ° °Risk  Factors/Current Problems:  Substance Use    ° °Cognitive State:  Alert  , Linear Thinking    ° °Mood/Affect:  Bright  , Happy  , Interested    ° °CSW Assessment: CSW consulted for "hx anxiety/depression, bipolar, THC use, LPNC @ 34 weeks (1 visit)." CSW met with MOB to assess. CSW introduced self and role. CSW observed MOB holding sleeping infant. CSW informed MOB of reason for consult and assessed mood. MOB reported she is currently doing well. MOB expressed she only had one prenatal visit due to being in denial about being pregnant. MOB shared she did not want to believe she was pregnant again. CSW expressed understanding. CSW inquired on MOB substance use. MOB disclosed she smoked THC both recreationally and for anxiety, MOB reports last smoking a couple of weeks ago. MOB denies any additional substance use. CSW informed MOB of the hospital drug screen policy. MOB aware a CPS report will be made if infant test positive for substances. MOB disclosed she recently had a [place] CPS case closed in 2022 for her oldest son. MOB reported her son was having behavioral challenges and had to be sent to a mental health facility. MOB stated the case has been closed and there were no substantiations made.  ° °CSW inquired on MOB mental health history. MOB acknowledged a diagnosis of anxiety, depression   and bipolar. MOB stated she was diagnosed years ago and she was "perfectly fine" during the pregnancy. MOB stated she was taking medication for the diagnosis a couple of years ago and she does not know if it was helpful due to discontinuing the use. MOB stated she has also went to therapy in the past, but did not find it to be helpful. MOB agrees to notify a professional if mental health needs arise postpartum. MOB identified her parents, friends, sister and FOB as supports. MOB denies any current SI, HI or being involved in DV.  ° °CSW provided education regarding the baby blues period versus PPD and provided  resources. CSW provided the New Mom Checklist.  °CSW provided review of Sudden Infant Death Syndrome (SIDS) precautions. MOB reported infant will sleep in a crib. MOB has a car seat and infant essentials. MOB denies any current stressors and reported no additional needs at this time.  ° °CSW will continue to follow UDS/CDS and make a CPS report if warranted. CSW identifies no further need for intervention and no barriers to discharge at this time. ° °CSW Plan/Description:  No Further Intervention Required/No Barriers to Discharge, Hospital Drug Screen Policy Information, Perinatal Mood and Anxiety Disorder (PMADs) Education, Sudden Infant Death Syndrome (SIDS) Education, CSW Will Continue to Monitor Umbilical Cord Tissue Drug Screen Results and Make Report if Warranted, Child Protective Service Report  , Other Information/Referral to Community Resources, Other Patient/Family Education  ° ° °Amber [name] Amber [name], LCSWA °04/24/2021, 11:27 AM °

## 2021-04-25 ENCOUNTER — Encounter: Payer: Self-pay | Admitting: *Deleted

## 2021-04-28 ENCOUNTER — Inpatient Hospital Stay (HOSPITAL_COMMUNITY): Payer: Medicaid Other

## 2021-04-28 ENCOUNTER — Inpatient Hospital Stay (HOSPITAL_COMMUNITY): Admission: AD | Admit: 2021-04-28 | Payer: Medicaid Other | Source: Home / Self Care | Admitting: Family Medicine

## 2021-05-02 ENCOUNTER — Ambulatory Visit (INDEPENDENT_AMBULATORY_CARE_PROVIDER_SITE_OTHER): Payer: Medicaid Other | Admitting: Primary Care

## 2021-05-03 ENCOUNTER — Telehealth (HOSPITAL_COMMUNITY): Payer: Self-pay | Admitting: *Deleted

## 2021-05-03 NOTE — Telephone Encounter (Signed)
Phone voicemail message left to return nurse call.  Duffy Rhody, RN 05-03-2021 at 12:06pm

## 2021-05-06 ENCOUNTER — Ambulatory Visit (INDEPENDENT_AMBULATORY_CARE_PROVIDER_SITE_OTHER): Payer: Medicaid Other | Admitting: Licensed Clinical Social Worker

## 2021-05-06 ENCOUNTER — Ambulatory Visit (INDEPENDENT_AMBULATORY_CARE_PROVIDER_SITE_OTHER): Payer: Medicaid Other | Admitting: Obstetrics & Gynecology

## 2021-05-06 ENCOUNTER — Other Ambulatory Visit: Payer: Self-pay

## 2021-05-06 DIAGNOSIS — Z8659 Personal history of other mental and behavioral disorders: Secondary | ICD-10-CM | POA: Diagnosis not present

## 2021-05-06 NOTE — Progress Notes (Signed)
Pt is in office for BP and mood check. Pt is 2 week postpartum.  Depression screen is negative Blood pressure 132/88, pulse (!) 103, weight 122 lb (55.3 kg), unknown if currently breastfeeding.  Current Outpatient Medications:    acetaminophen (TYLENOL) 325 MG tablet, Take 2 tablets (650 mg total) by mouth every 4 (four) hours as needed (for pain scale < 4). (Patient not taking: Reported on 05/06/2021), Disp: 30 tablet, Rfl: 0   ibuprofen (ADVIL) 600 MG tablet, Take 1 tablet (600 mg total) by mouth every 6 (six) hours. (Patient not taking: Reported on 05/06/2021), Disp: 30 tablet, Rfl: 0   NIFEdipine (ADALAT CC) 30 MG 24 hr tablet, Take 1 tablet (30 mg total) by mouth daily. (Patient not taking: Reported on 05/06/2021), Disp: 30 tablet, Rfl: 2   Prenat w/o A Vit-FeFum-FePo-FA (CONCEPT OB) 130-92.4-1 MG CAPS, Take 1 capsule by mouth daily. (Patient not taking: Reported on 05/06/2021), Disp: 30 capsule, Rfl: 5 Normal mood NAD  Doing well PP 13 days RTC for 6 week PP visit and will further discuss contraception, for now will abstain  Adam Phenix, MD 05/06/2021

## 2021-05-08 ENCOUNTER — Encounter: Payer: Medicaid Other | Admitting: Licensed Clinical Social Worker

## 2021-05-09 NOTE — BH Specialist Note (Signed)
Integrated Behavioral Health via Telemedicine Visit  05/09/2021 Amber [name] [id number]  Number of Integrated Behavioral Health Clinician visits: 1 Session Start time: 145pm  Session End time: 2:10pm Total time in minutes: 25 mins via mychart video   Referring Provider: Mercy Riding CMA Patient/Family location: Home  [facility] Provider location: [facility]  All persons participating in visit: Amber [name] and LCSW A.Chrisean Kloth Types of Service: Individual psychotherapy  I connected with Amber [name] and/or Amber [name] n/a via  Telephone or Engineer, civil (consulting)  (Video is Surveyor, mining) and verified that I am speaking with the correct person using two identifiers. Discussed confidentiality: Yes   I discussed the limitations of telemedicine and the availability of in person appointments.  Discussed there is a possibility of technology failure and discussed alternative modes of communication if that failure occurs.  I discussed that engaging in this telemedicine visit, they consent to the provision of behavioral healthcare and the services will be billed under their insurance.  Patient and/or legal guardian expressed understanding and consented to Telemedicine visit: Yes   Presenting Concerns: Patient and/or family reports the following symptoms/concerns: limited prenatal care, mood check Duration of problem: approx 2 weeks; Severity of problem: mild  Patient and/or Family's Strengths/Protective Factors: Concrete supports in place (healthy food, safe environments, etc.)  Goals Addressed: Patient will:  Reduce symptoms of: hx of bipolar disorder    Increase knowledge and/or ability of: coping skills   Demonstrate ability to: Increase healthy adjustment to current life circumstances  Progress towards Goals: Ongoing  Interventions: Interventions utilized:  Supportive Counseling Standardized Assessments completed: Amber [name] Postnatal Depression  Patient  and/or Family Response: Amber [name] responded well to mychart video   Assessment: Patient reports history of bipolar. .   Patient may benefit from integrated behavioral health  Plan: Follow up with behavioral health clinician on : as needed Behavioral recommendations: prioritize rest, collaborate with pcp and bh provider additional support, communicate needs with partner for added support Referral(s): Integrated [employer] (In Clinic)  I discussed the assessment and treatment plan with the patient and/or parent/guardian. They were provided an opportunity to ask questions and all were answered. They agreed with the plan and demonstrated an understanding of the instructions.   They were advised to call back or seek an in-person evaluation if the symptoms worsen or if the condition fails to improve as anticipated.  Gwyndolyn Saxon, LCSW

## 2021-05-23 ENCOUNTER — Encounter: Payer: Self-pay | Admitting: Obstetrics and Gynecology

## 2021-05-23 ENCOUNTER — Ambulatory Visit (INDEPENDENT_AMBULATORY_CARE_PROVIDER_SITE_OTHER): Payer: Medicaid Other | Admitting: Obstetrics and Gynecology

## 2021-05-23 ENCOUNTER — Other Ambulatory Visit: Payer: Self-pay

## 2021-05-23 VITALS — BP 134/88 | HR 87 | Ht 61.0 in | Wt 124.0 lb

## 2021-05-23 DIAGNOSIS — Z3042 Encounter for surveillance of injectable contraceptive: Secondary | ICD-10-CM | POA: Diagnosis not present

## 2021-05-23 LAB — POCT URINE PREGNANCY: Preg Test, Ur: NEGATIVE

## 2021-05-23 MED ORDER — MEDROXYPROGESTERONE ACETATE 150 MG/ML IM SUSP: 150.0000 mg | INTRAMUSCULAR | 4 refills | Status: AC

## 2021-05-23 MED ORDER — MEDROXYPROGESTERONE ACETATE 150 MG/ML IM SUSP
150.0000 mg | Freq: Once | INTRAMUSCULAR | Status: AC
  Administered 2021-05-23: 16:00:00 150 mg via INTRAMUSCULAR

## 2021-05-23 NOTE — Patient Instructions (Addendum)
For blood pressure surveillance try ?[facility] or [facility] ?

## 2021-05-23 NOTE — Progress Notes (Signed)
UPT Today is Negative ? ?DEPO Injection given in LUOQ, tolerated well. ?Next DEPO is due May 25 - August 22, 2021 ? ?Administrations This Visit   ? ? medroxyPROGESTERone (DEPO-PROVERA) injection 150 mg   ? ? Admin Date ?05/23/2021 Action ?Given Dose ?150 mg Route ?Intramuscular Administered By ?Tamela Oddi, RMA  ? ?  ?  ? ?  ?  ? ?  ?

## 2021-05-23 NOTE — Progress Notes (Deleted)
? ?Established Patient Office Visit ? ?Subjective:  ?Patient ID: Amber [name], female    DOB: 11/17/1980  Age: 41 y.o. MRN: [id number] ? ?CC:  ?Chief Complaint  ?Patient presents with  ? Postpartum Care  ? ? ?HPI ?Amber [name] presents for *** ? ?Past Medical History:  ?Diagnosis Date  ? Anemia   ? Anxiety   ? Bipolar 1 disorder (HCC)   ? Depression   ? doing fine  ? Gestational diabetes 12/02/2018  ? Gestational diabetes 12/02/2018  ? 1 hr was 183 Diet controlled  ? Headache   ? Pregnancy induced hypertension   ? ? ?Past Surgical History:  ?Procedure Laterality Date  ? CESAREAN SECTION    ? ? ?Family History  ?Problem Relation Age of Onset  ? Arthritis Mother   ? Hypertension Mother   ? Healthy Father   ? Alcohol abuse Neg Hx   ? Asthma Neg Hx   ? Birth defects Neg Hx   ? Cancer Neg Hx   ? COPD Neg Hx   ? Depression Neg Hx   ? Diabetes Neg Hx   ? Drug abuse Neg Hx   ? Early death Neg Hx   ? Heart disease Neg Hx   ? Hearing loss Neg Hx   ? Hyperlipidemia Neg Hx   ? Kidney disease Neg Hx   ? Learning disabilities Neg Hx   ? Mental illness Neg Hx   ? Mental retardation Neg Hx   ? Miscarriages / Stillbirths Neg Hx   ? Stroke Neg Hx   ? Vision loss Neg Hx   ? Varicose Veins Neg Hx   ? ? ?Social History  ? ?Socioeconomic History  ? Marital status: Single  ?  Spouse name: Not on file  ? Number of children: Not on file  ? Years of education: Not on file  ? Highest education level: Not on file  ?Occupational History  ? Not on file  ?Tobacco Use  ? Smoking status: Every Day  ?  Packs/day: 0.50  ?  Years: 21.00  ?  Pack years: 10.50  ?  Types: Cigarettes  ? Smokeless tobacco: Never  ?Vaping Use  ? Vaping Use: Never used  ?Substance and Sexual Activity  ? Alcohol use: Not Currently  ?  Comment: socially  ? Drug use: No  ? Sexual activity: Not Currently  ?  Birth control/protection: None  ?Other Topics Concern  ? Not on file  ?Social History Narrative  ? Not on file  ? ?Social Determinants of Health  ? ?Financial Resource  Strain: Not on file  ?Food Insecurity: Not on file  ?Transportation Needs: Not on file  ?Physical Activity: Not on file  ?Stress: Not on file  ?Social Connections: Not on file  ?Intimate Partner Violence: Not on file  ? ? ?Outpatient Medications Prior to Visit  ?Medication Sig Dispense Refill  ? acetaminophen (TYLENOL) 325 MG tablet Take 2 tablets (650 mg total) by mouth every 4 (four) hours as needed (for pain scale < 4). (Patient not taking: Reported on 05/06/2021) 30 tablet 0  ? ibuprofen (ADVIL) 600 MG tablet Take 1 tablet (600 mg total) by mouth every 6 (six) hours. (Patient not taking: Reported on 05/06/2021) 30 tablet 0  ? NIFEdipine (ADALAT CC) 30 MG 24 hr tablet Take 1 tablet (30 mg total) by mouth daily. (Patient not taking: Reported on 05/06/2021) 30 tablet 2  ? Prenat w/o A Vit-FeFum-FePo-FA (CONCEPT OB) 130-92.4-1 MG CAPS Take 1 capsule  by mouth daily. (Patient not taking: Reported on 05/06/2021) 30 capsule 5  ? ?No facility-administered medications prior to visit.  ? ? ?No Known Allergies ? ?ROS ?Review of Systems ? ?  ?Objective:  ?  ?Physical Exam ? ?BP (!) 142/95   Pulse 88   Ht 5\' 1"  (1.549 m)   Wt 124 lb (56.2 kg)   Breastfeeding No   BMI 23.43 kg/m?  ?Wt Readings from Last 3 Encounters:  ?05/23/21 124 lb (56.2 kg)  ?05/06/21 122 lb (55.3 kg)  ?04/23/21 141 lb (64 kg)  ? ? ? ?Health Maintenance Due  ?Topic Date Due  ? COVID-19 Vaccine (1) Never done  ? Hepatitis C Screening  Never done  ? INFLUENZA VACCINE  Never done  ? ? ?There are no preventive care reminders to display for this patient. ? ?No results found for: TSH ?Lab Results  ?Component Value Date  ? WBC 12.1 (H) 04/23/2021  ? HGB 11.6 (L) 04/23/2021  ? HCT 36.0 04/23/2021  ? MCV 95.7 04/23/2021  ? PLT 136 (L) 04/23/2021  ? ?Lab Results  ?Component Value Date  ? NA 138 04/23/2021  ? K 4.2 04/23/2021  ? CO2 23 04/23/2021  ? GLUCOSE 68 (L) 04/23/2021  ? BUN 7 04/23/2021  ? CREATININE 0.82 04/23/2021  ? BILITOT 0.1 (L) 04/23/2021  ? ALKPHOS  189 (H) 04/23/2021  ? AST 12 (L) 04/23/2021  ? ALT 9 04/23/2021  ? PROT 5.7 (L) 04/23/2021  ? ALBUMIN 2.8 (L) 04/23/2021  ? CALCIUM 8.9 04/23/2021  ? ANIONGAP 10 04/23/2021  ? ?No results found for: CHOL ?No results found for: HDL ?No results found for: LDLCALC ?No results found for: TRIG ?No results found for: CHOLHDL ?Lab Results  ?Component Value Date  ? HGBA1C 5.7 (H) 04/08/2021  ? ? ?  ?Assessment & Plan:  ? ?Problem List Items Addressed This Visit   ?None ? ? ?No orders of the defined types were placed in this encounter. ? ? ?Follow-up: No follow-ups on file.  ? ? ?04/10/2021, MD ?

## 2021-05-23 NOTE — Progress Notes (Signed)
? ? ?Post Partum Visit Note ? ?Amber [name] is a 41 y.o. [phone] female who presents for a postpartum visit. She is 4 weeks postpartum following a normal spontaneous vaginal delivery.  I have fully reviewed the prenatal and intrapartum course. The delivery was at 36.2 gestational weeks.  Anesthesia: epidural. Postpartum course has been unremarkable. Baby is doing well. Baby is feeding by bottle - Similac Neosure. Bleeding no bleeding. Bowel function is normal. Bladder function is normal. Patient is not sexually active. Contraception method is none. Postpartum depression screening: negative. ? ? Upstream - 05/23/21 1524   ? ?  ? Pregnancy Intention Screening  ? Does the patient want to become pregnant in the next year? No   ? Does the patient's partner want to become pregnant in the next year? No   ? Would the patient like to discuss contraceptive options today? Yes   ? ?  ?  ? ?  ? ?The pregnancy intention screening data noted above was reviewed. Potential methods of contraception were discussed. The patient elected to proceed with No data recorded. ? ? Edinburgh Postnatal Depression Scale - 05/23/21 1524   ? ?  ? Edinburgh Postnatal Depression Scale:  In the Past 7 Days  ? I have been able to laugh and see the funny side of things. 0   ? I have looked forward with enjoyment to things. 0   ? I have blamed myself unnecessarily when things went wrong. 0   ? I have been anxious or worried for no good reason. 0   ? I have felt scared or panicky for no good reason. 0   ? Things have been getting on top of me. 0   ? I have been so unhappy that I have had difficulty sleeping. 0   ? I have felt sad or miserable. 0   ? I have been so unhappy that I have been crying. 0   ? The thought of harming myself has occurred to me. 0   ? Edinburgh Postnatal Depression Scale Total 0   ? ?  ?  ? ?  ? ? ?Health Maintenance Due  ?Topic Date Due  ? COVID-19 Vaccine (1) Never done  ? Hepatitis C Screening  Never done  ? INFLUENZA VACCINE   Never done  ? ? ?The following portions of the patient's history were reviewed and updated as appropriate: allergies, current medications, past family history, past medical history, past social history, past surgical history, and problem list. ? ?Review of Systems ?Pertinent items are noted in HPI. ? ?Objective:  ?BP (!) 142/95   Pulse 88   Ht 5\' 1"  (1.549 m)   Wt 124 lb (56.2 kg)   Breastfeeding No   BMI 23.43 kg/m?   ? ?General:  alert, cooperative, and no distress  ? Breasts:  not indicated  ?Lungs: clear to auscultation bilaterally  ?Heart:  regular rate and rhythm  ?Abdomen: soft, non-tender; bowel sounds normal; no masses,  no organomegaly   ?Wound N/a  ?GU exam:  not indicated  ?     ?Assessment:  ? ?Encounter for postpartum ?  ? ?normal postpartum exam.  ? ?Plan:  ? ?Essential components of care per ACOG recommendations: ? ?1.  Mood and well being: Patient with negative depression screening today. Reviewed local resources for support.  ?- Patient tobacco use? Yes. Patient desires to quit? No.   ?- hx of drug use? No.   ? ?2. Infant care and feeding:  ?-  Patient currently breastmilk feeding? No.  ?-Social determinants of health (SDOH) reviewed in EPIC. The following needs were identified tobacco use ? ?3. Sexuality, contraception and birth spacing ?- Patient does not want a pregnancy in the next year.  Desired family size is 5 children.  ?- Reviewed reproductive life planning. Reviewed contraceptive methods based on pt preferences and effectiveness.  Patient desired Depo-Provera today.   ?- Discussed birth spacing of 18 months ? ?4. Sleep and fatigue ?-Encouraged family/partner/community support of 4 hrs of uninterrupted sleep to help with mood and fatigue ? ?5. Physical Recovery  ?- Discussed patients delivery and complications. She describes her labor as mixed. ?- Patient had a Vaginal, no problems at delivery. Patient had ano lacerations. Perineal healing reviewed. Patient expressed understanding ?-  Patient has urinary incontinence? No. ?- Patient is safe to resume physical and sexual activity ? ?6.  Health Maintenance ?- HM due items addressed Yes ?- Last pap smear  ?Diagnosis  ?Date Value Ref Range Status  ?04/11/2021   Final  ? - Negative for intraepithelial lesion or malignancy (NILM)  ? Pap smear not done at today's visit.  ?-Breast Cancer screening indicated? No.  ? ?7. Chronic Disease/Pregnancy Condition follow up: Hypertension ?PT advised to find PCP for HTN surveillance asap and to continue with her procardia ?- PCP follow up ? ?Mariel Aloe, MD ?Center for [facility], [facility]  ?

## 2021-05-28 IMAGING — US US MFM OB DETAIL +14 WK
1 series · 13 of 28 positions shown · non-contrast
Comparison: none

[Series 1: us mfm ob detail +14 wk · 13 of 70 slices shown]
[im 3/70]
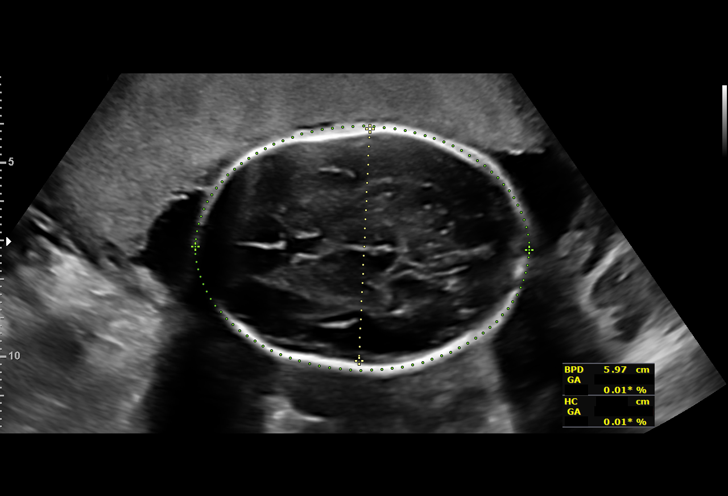
[im 8/70]
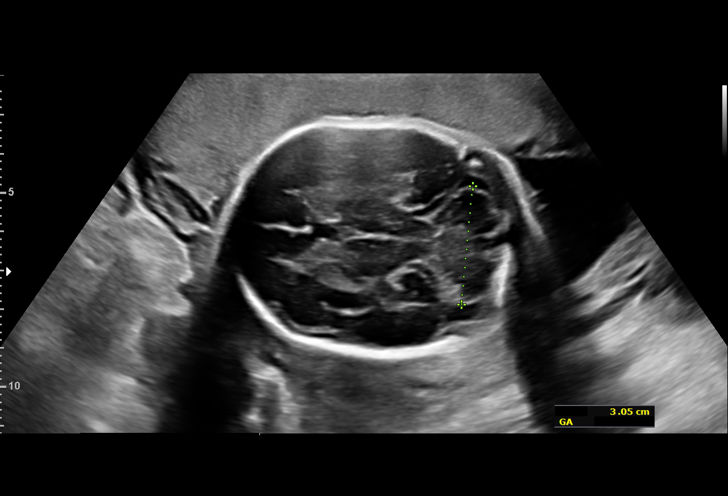
[im 13/70]
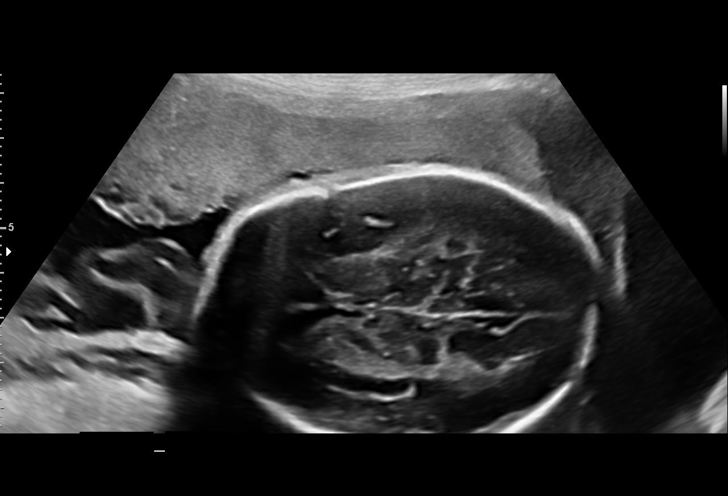
[im 18/70]
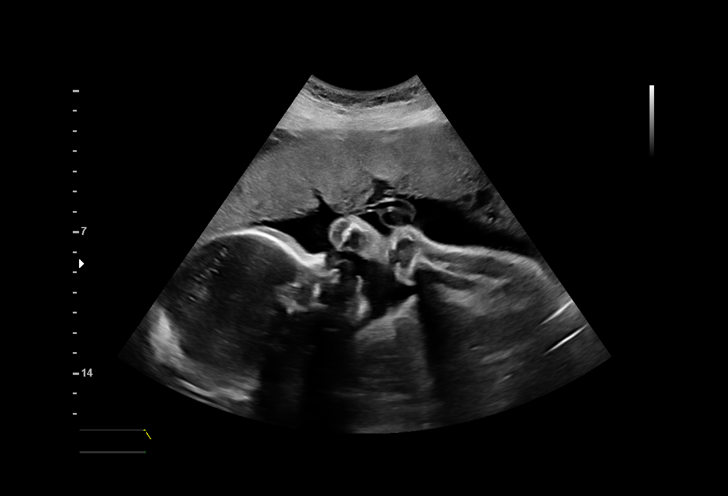
[im 24/70]
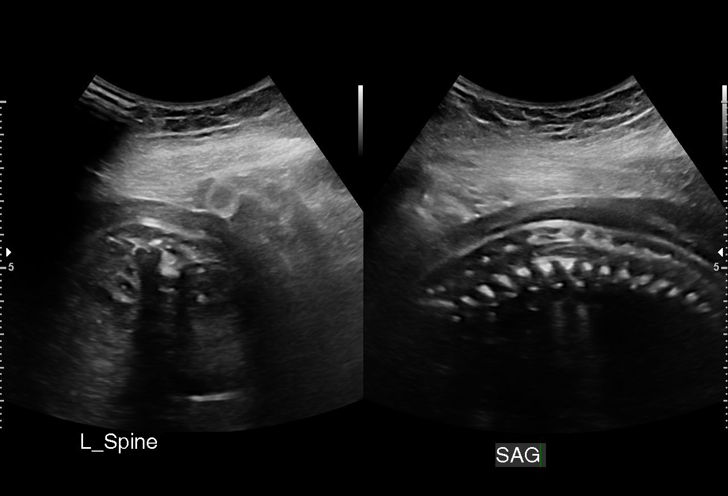
[im 29/70]
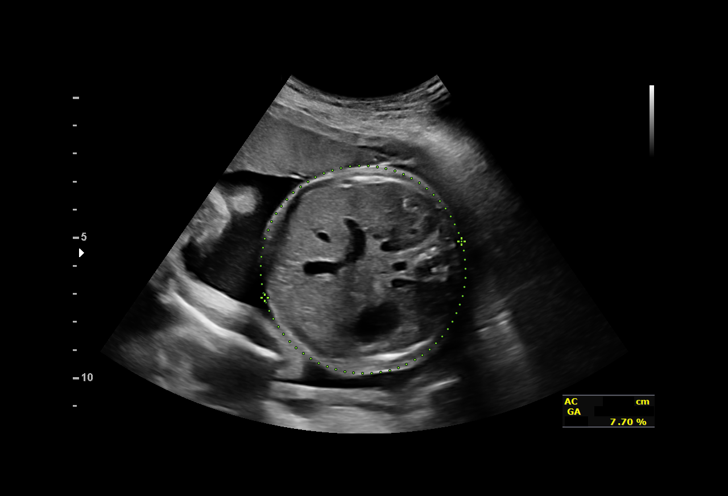
[im 36/70]
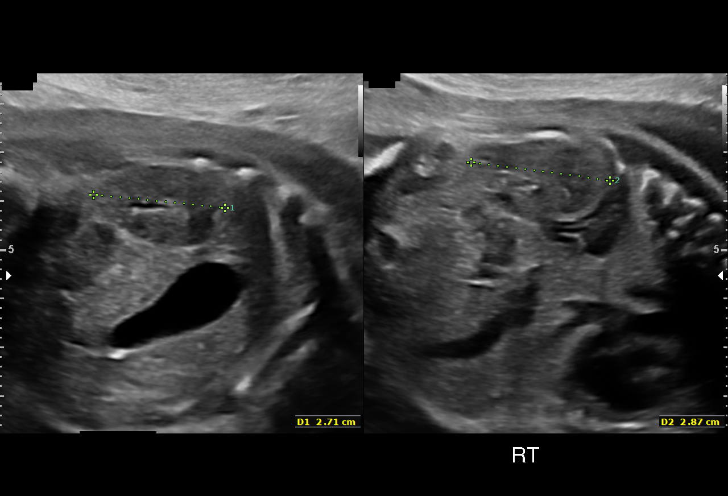
[im 41/70]
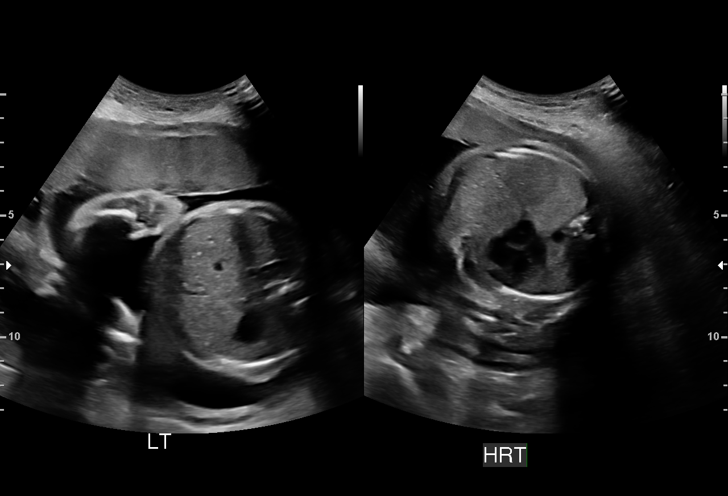
[im 47/70]
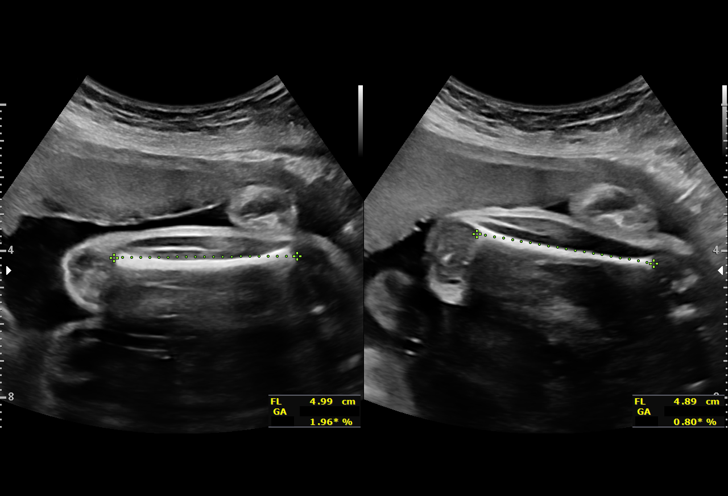
[im 52/70]
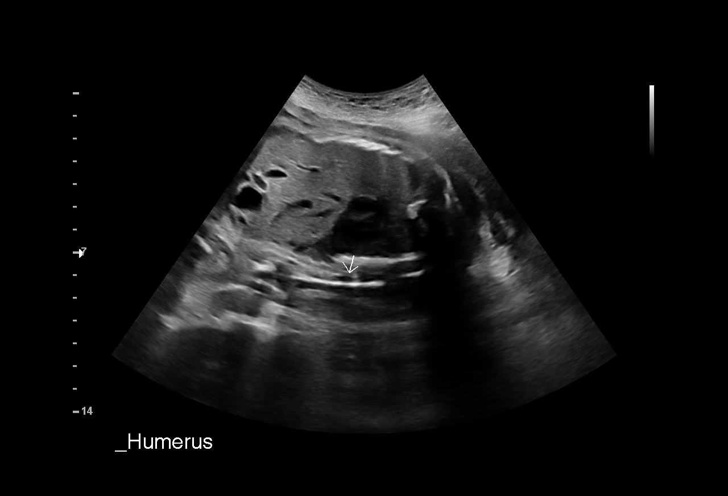
[im 57/70]
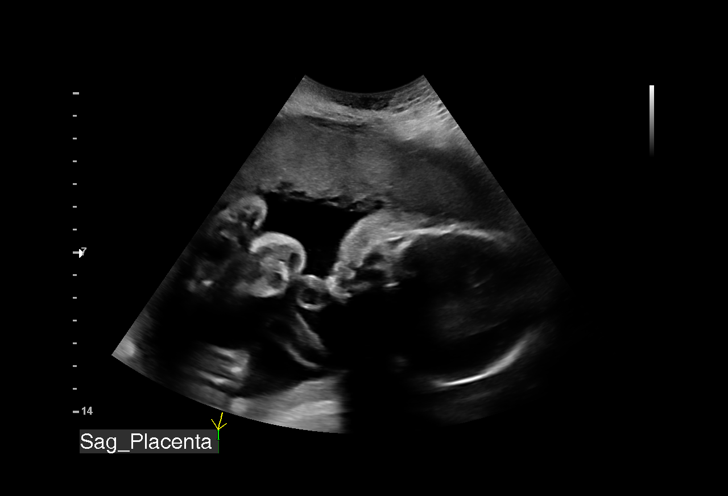
[im 62/70]
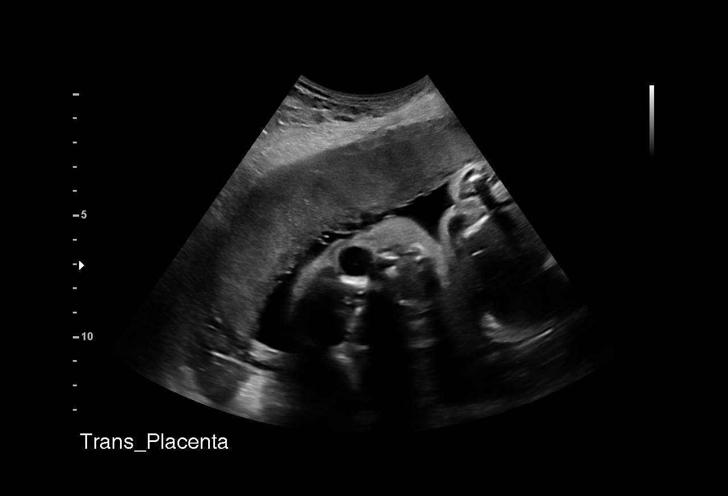
[im 67/70]
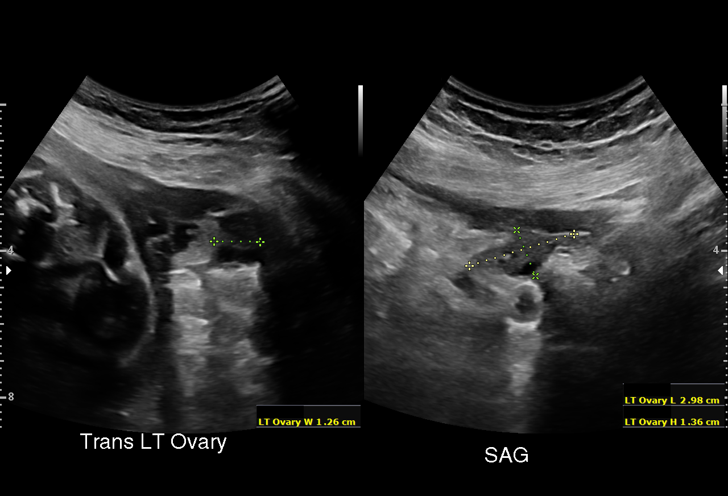

[13 of 28 positions shown; findings below may reference images not displayed]

----------------------------------------------------------------------

 ----------------------------------------------------------------------
Indications

  Advanced maternal age multigravida 35+,
  second trimester
  26 weeks gestation of pregnancy
  Encounter for antenatal screening for
  malformations (low risk NIPS)
  Late prenatal care, second trimester
  Poor obstetric history: Previous preterm
  delivery, antepartum (08w0d)
  History of cesarean delivery, currently
  pregnant
  Smoking complicating pregnancy, second
  trimester (0.5 pk/day)
  Poor obstetric history: Previous
  preeclampsia / eclampsia/gestational HTN
  (1st pregnancy, ASA)
 ----------------------------------------------------------------------
Vital Signs

 BMI:
Fetal Evaluation

 Num Of Fetuses:         1
 Fetal Heart Rate(bpm):  149
 Cardiac Activity:       Observed
 Presentation:           Cephalic
 Placenta:               Anterior
 P. Cord Insertion:      Visualized
 Amniotic Fluid
 AFI FV:      Within normal limits

                             Largest Pocket(cm)

Biometry

 BPD:      60.5  mm     G. Age:  24w 4d          5  %    CI:        66.56   %    70 - 86
                                                         FL/HC:      20.7   %    18.6 -
 HC:      237.7  mm     G. Age:  25w 6d         16  %    HC/AC:      1.02        1.04 -
 AC:      233.3  mm     G. Age:  27w 5d         84  %    FL/BPD:     81.5   %    71 - 87
 FL:       49.3  mm     G. Age:  26w 4d         50  %    FL/AC:      21.1   %    20 - 24
 HUM:      45.4  mm     G. Age:  26w 6d         63  %
 CER:      30.6  mm     G. Age:  26w 6d         65  %

 LV:        2.9  mm
 CM:        2.8  mm

 Est. FW:    6002  gm      2 lb 3 oz     72  %
OB History

 Gravidity:    5         Term:   2        Prem:   1        SAB:   1
 Living:       3
Gestational Age

 LMP:           29w 0d        Date:  03/31/18                 EDD:   01/05/19
 U/S Today:     26w 1d                                        EDD:   01/25/19
 Best:          26w 1d     Det. By:  U/S (10/20/18)           EDD:   01/25/19
Anatomy

 Cranium:               Appears normal         LVOT:                   Not well visualized
 Cavum:                 Appears normal         Aortic Arch:            Not well visualized
 Ventricles:            Appears normal         Ductal Arch:            Not well visualized
 Choroid Plexus:        Appears normal         Diaphragm:              Appears normal
 Cerebellum:            Appears normal         Stomach:                Appears normal, left
                                                                       sided
 Posterior Fossa:       Appears normal         Abdomen:                Appears normal
 Nuchal Fold:           Not applicable (>20    Abdominal Wall:         Appears nml (cord
                        wks GA)                                        insert, abd wall)
 Face:                  Appears normal         Cord Vessels:           Appears normal (3
                        (orbits and profile)                           vessel cord)
 Lips:                  Appears normal         Kidneys:                Appear normal
 Palate:                Not well visualized    Bladder:                Appears normal
 Thoracic:              Appears normal         Spine:                  Ltd views no
                                                                       intracranial signs of
                                                                       NTD
 Heart:                 Not well visualized    Upper Extremities:      RUE appears nl;
                                                                       LUE nwv
 RVOT:                  Not well visualized    Lower Extremities:      Appears normal

 Other:  Male gender. Heels visualized. Nasal bone visualized. Technically
         difficult due to fetal position.
Cervix Uterus Adnexa
 Cervix
 Length:           4.19  cm.
 Normal appearance by transabdominal scan.

 Uterus
 No abnormality visualized.

 Left Ovary
 Size(cm)        3   x   1.3    x  1.4       Vol(ml):
 Within normal limits.

 Right Ovary
 Size(cm)       3.3  x   1.7    x  1.2       Vol(ml):
 Within normal limits.
Impression

 Late prenatal care. Unsure of LMP dates.

 On cell-free fetal DNA screening, the risks of fetal
 aneuploidies are not increased.

 We performed fetal anatomy scan. No makers of
 aneuploidies or fetal structural defects are seen. Fetal
 biometry is consistent with 26w 1d gestation. Amniotic fluid is
 normal and good fetal activity is seen. Patient understands
 the limitations of ultrasound in detecting fetal anomalies.
 We have assigned her EDD at 01/25/2019.
Recommendations

 An appointment was made for her to return in 4 weeks for
 completion of fetal anatomy and to assess interval growth.
                 Pate, Jean Delerme

## 2021-06-19 ENCOUNTER — Encounter: Payer: Self-pay | Admitting: Obstetrics and Gynecology

## 2021-08-12 ENCOUNTER — Ambulatory Visit: Payer: Medicaid Other

## 2021-08-15 ENCOUNTER — Ambulatory Visit: Payer: Medicaid Other

## 2021-12-14 ENCOUNTER — Emergency Department (HOSPITAL_COMMUNITY)
Admission: EM | Admit: 2021-12-14 | Discharge: 2021-12-15 | Discharge status: Against Medical Advice | Payer: Medicaid Other

## 2021-12-14 NOTE — ED Notes (Signed)
PT called X2 for triage no response 

## 2021-12-14 NOTE — ED Notes (Signed)
Pt called for triage, no answer

## 2021-12-15 ENCOUNTER — Ambulatory Visit (INDEPENDENT_AMBULATORY_CARE_PROVIDER_SITE_OTHER): Payer: Medicaid Other

## 2021-12-15 ENCOUNTER — Ambulatory Visit (HOSPITAL_COMMUNITY)
Admission: EM | Admit: 2021-12-15 | Discharge: 2021-12-15 | Disposition: A | Discharge status: Home / Self Care | Payer: Medicaid Other | Attending: Internal Medicine | Admitting: Internal Medicine

## 2021-12-15 ENCOUNTER — Encounter (HOSPITAL_COMMUNITY): Payer: Self-pay

## 2021-12-15 DIAGNOSIS — S63125A Dislocation of unspecified interphalangeal joint of left thumb, initial encounter: Secondary | ICD-10-CM

## 2021-12-15 DIAGNOSIS — W19XXXA Unspecified fall, initial encounter: Secondary | ICD-10-CM

## 2021-12-15 MED ORDER — IBUPROFEN 600 MG PO TABS: 600.0000 mg | ORAL_TABLET | Freq: Four times a day (QID) | ORAL | 0 refills | Status: AC | PRN

## 2021-12-15 MED ORDER — IBUPROFEN 800 MG PO TABS
800.0000 mg | ORAL_TABLET | Freq: Once | ORAL | Status: AC
  Administered 2021-12-15: 800 mg via ORAL

## 2021-12-15 MED ORDER — IBUPROFEN 800 MG PO TABS
ORAL_TABLET | ORAL | Status: AC
  Filled 2021-12-15: qty 1

## 2021-12-15 NOTE — ED Provider Notes (Addendum)
[facility]    CSN: [id number] Arrival date & time: 12/15/21  1115      History   Chief Complaint Chief Complaint  Patient presents with   Finger Injury    HPI Amber [name] is a 41 y.o. female.   Pleasant 41 year old female presents due to concern of a left thumb injury.  She states yesterday she was outside playing in the yard and tripped over a kids toy in the yard.  She cannot remember exactly how she fell, but states she has been having distal thumb pain since.  She reports some tingling, but does have some sensation.  Denies any bleeding but is concerned of swelling to the IP joint.  Took some Tylenol last evening, but nothing this morning.  States she is unable to bend the IP joint.     Past Medical History:  Diagnosis Date   Anemia    Anxiety    Bipolar 1 disorder (HCC)    Depression    doing fine   Gestational diabetes 12/02/2018   Gestational diabetes 12/02/2018   1 hr was 183 Diet controlled   Headache    Pregnancy induced hypertension     Patient Active Problem List   Diagnosis Date Noted   Encounter for postpartum visit 05/23/2021   Increased SMA risk on [place] 10/25/2018   History of preterm delivery 10/11/2018   History of VBAC 10/11/2018   Bipolar 1 disorder (HCC)     Past Surgical History:  Procedure Laterality Date   CESAREAN SECTION      OB History     Gravida  6   Para  5   Term  3   Preterm  2   AB  1   Living  5      SAB  0   IAB  0   Ectopic  1   Multiple  0   Live Births  5            Home Medications    Prior to Admission medications   Medication Sig Start Date End Date Taking? Authorizing Provider  ibuprofen (ADVIL) 600 MG tablet Take 1 tablet (600 mg total) by mouth every 6 (six) hours as needed. 12/15/21  Yes Katielynn Horan L, PA  medroxyPROGESTERone (DEPO-PROVERA) 150 MG/ML injection Inject 1 mL (150 mg total) into the muscle every 3 (three) months. 05/23/21  Yes Warden Fillers, MD   famotidine (PEPCID) 20 MG tablet Take 1 tablet (20 mg total) by mouth 2 (two) times daily. Patient not taking: Reported on 02/22/2019 12/31/18 04/18/19  Hurshel Party, CNM    Family History Family History  Problem Relation Age of Onset   Arthritis Mother    Hypertension Mother    Healthy Father    Alcohol abuse Neg Hx    Asthma Neg Hx    Birth defects Neg Hx    Cancer Neg Hx    COPD Neg Hx    Depression Neg Hx    Diabetes Neg Hx    Drug abuse Neg Hx    Early death Neg Hx    Heart disease Neg Hx    Hearing loss Neg Hx    Hyperlipidemia Neg Hx    Kidney disease Neg Hx    Learning disabilities Neg Hx    Mental illness Neg Hx    Mental retardation Neg Hx    Miscarriages / Stillbirths Neg Hx    Stroke Neg Hx    Vision loss  Neg Hx    Varicose Veins Neg Hx     Social History Social History   Tobacco Use   Smoking status: Every Day    Packs/day: 0.50    Years: 21.00    Total pack years: 10.50    Types: Cigarettes   Smokeless tobacco: Never  Vaping Use   Vaping Use: Never used  Substance Use Topics   Alcohol use: Not Currently    Comment: socially   Drug use: No     Allergies   Patient has no known allergies.   Review of Systems Review of Systems As per HPI  Physical Exam Triage Vital Signs ED Triage Vitals  Enc Vitals Group     BP 12/15/21 1259 (!) 126/96     Pulse Rate 12/15/21 1259 94     Resp 12/15/21 1259 16     Temp 12/15/21 1259 98.5 F (36.9 C)     Temp Source 12/15/21 1259 Oral     SpO2 12/15/21 1259 100 %     Weight --      Height --      Head Circumference --      Peak Flow --      Pain Score 12/15/21 1301 8     Pain Loc --      Pain Edu? --      Excl. in GC? --    No data found.  Updated Vital Signs BP (!) 126/96 (BP Location: Right Arm)   Pulse 94   Temp 98.5 F (36.9 C) (Oral)   Resp 16   LMP  (LMP Unknown)   SpO2 100%   Breastfeeding No   Visual Acuity Right Eye Distance:   Left Eye Distance:   Bilateral  Distance:    Right Eye Near:   Left Eye Near:    Bilateral Near:     Physical Exam Vitals and nursing note reviewed.  Constitutional:      General: She is not in acute distress.    Appearance: Normal appearance. She is normal weight. She is not ill-appearing or toxic-appearing.  HENT:     Head: Normocephalic and atraumatic.  Cardiovascular:     Rate and Rhythm: Normal rate.  Pulmonary:     Effort: Pulmonary effort is normal. No respiratory distress.  Musculoskeletal:        General: Swelling, tenderness (to L thumb IP joint with some swelling. Unable to flex IP joint) and signs of injury (distal phalanx appears dislocated on exam) present.  Skin:    General: Skin is warm and dry.     Findings: Bruising (mild to distal phalanx) present. No rash.  Neurological:     General: No focal deficit present.     Mental Status: She is alert and oriented to person, place, and time.     Sensory: No sensory deficit.     Motor: No weakness.  Psychiatric:        Mood and Affect: Mood normal.        Behavior: Behavior normal.      UC Treatments / Results  Labs (all labs ordered are listed, but only abnormal results are displayed) Labs Reviewed - No data to display  EKG   Radiology DG Finger Thumb Left  Result Date: 12/15/2021 CLINICAL DATA:  Dislocated thumb reduced in clinic. Postreduction films to ensure no complications from procedure. EXAM: LEFT THUMB 2+V COMPARISON:  Left thumb radiographs 12/15/2021 FINDINGS: Interval relocation of the thumb interphalangeal joint. The joint space is preserved.  No acute fracture is seen. Mild thumb carpometacarpal joint space narrowing and peripheral osteophytosis. IMPRESSION: Interval relocation of the thumb interphalangeal joint. No acute fracture is seen. Electronically Signed   By: Yvonne Kendall M.D.   On: 12/15/2021 14:09   DG Finger Thumb Left  Result Date: 12/15/2021 CLINICAL DATA:  [place] yesterday.  Left thumb pain and swelling. EXAM: LEFT  THUMB 2+V COMPARISON:  None Available. FINDINGS: Posterior dislocation of the IP joint of the left thumb, distal phalanx posterior to head of the proximal phalanx. No fracture. First metacarpophalangeal joint is normally spaced and aligned. Mid to distal thumb soft tissue swelling. IMPRESSION: 1. Posterior dislocation of the IP joint of the left thumb. No fracture. Electronically Signed   By: Lajean Manes M.D.   On: 12/15/2021 13:27    Procedures Orthopedic Injury Treatment  Date/Time: 12/15/2021 2:18 PM  Performed by: Chaney Malling, PA Authorized by: Chaney Malling, PA   Consent:    Consent obtained:  Verbal   Consent given by:  Patient   Risks discussed:  Fracture, recurrent dislocation, restricted joint movement, stiffness and irreducible dislocation   Alternatives discussed:  Alternative treatmentInjury location: finger Location details: left thumb Injury type: dislocation Dislocation type: IP Pre-procedure neurovascular assessment: neurovascularly intact Pre-procedure distal perfusion: normal Pre-procedure neurological function: normal Pre-procedure range of motion: reduced  Anesthesia: Local anesthesia used: no  Patient sedated: NoImmobilization: splint Splint Applied by: ED Nurse Supplies used: aluminum splint Post-procedure neurovascular assessment: post-procedure neurovascularly intact Post-procedure distal perfusion: normal Post-procedure neurological function: normal Post-procedure range of motion: normal    (including critical care time)  Medications Ordered in UC Medications  ibuprofen (ADVIL) tablet 800 mg (800 mg Oral Given 12/15/21 1333)    Initial Impression / Assessment and Plan / UC Course  I have reviewed the triage vital signs and the nursing notes.  Pertinent labs & imaging results that were available during my care of the patient were reviewed by me and considered in my medical decision making (see chart for details).     Dislocation of L  thumb IP joint -original x-ray showed dislocation but no fracture.  Thumb was reduced in clinic.  Postreduction films show normal anatomical location of the distal phalanx, again without fracture.  Ibuprofen given in office.  Recommended patient keep her finger in the splint for the next week, 600 mg ibuprofen every 6-8 hours as needed to help with pain and swelling.  Follow-up with Ortho should dislocation occur again.   Final Clinical Impressions(s) / UC Diagnoses   Final diagnoses:  Dislocation of interphalangeal joint of left thumb, initial encounter     Discharge Instructions      The tip of your thumb was dislocated. We put it back in alignment. There are no fractures. Please wear the thumb splint placed today for one week.  Take 600mg  ibuprofen every 6-8 hours with food to help with swelling and pain. Please follow up with your PCP in one week should symptoms persist.     ED Prescriptions     Medication Sig Dispense Auth. Provider   ibuprofen (ADVIL) 600 MG tablet Take 1 tablet (600 mg total) by mouth every 6 (six) hours as needed. 30 tablet Anina Schnake L, [place]      PDMP not reviewed this encounter.   Chaney Malling, [place] 12/15/21 1417    Chaney Malling, [place] 12/15/21 1419

## 2021-12-15 NOTE — Discharge Instructions (Signed)
The tip of your thumb was dislocated. We put it back in alignment. There are no fractures. Please wear the thumb splint placed today for one week.  Take 600mg  ibuprofen every 6-8 hours with food to help with swelling and pain. Please follow up with your PCP in one week should symptoms persist.

## 2021-12-15 NOTE — ED Triage Notes (Signed)
Pain in the left thumb onset yesterday, with swelling. States it has been swollen and throbbing all night. Patient states she fell yesterday of a Childrens toy outside her house.

## 2022-11-13 ENCOUNTER — Other Ambulatory Visit (HOSPITAL_COMMUNITY): Payer: Self-pay

## 2022-11-20 ENCOUNTER — Other Ambulatory Visit (HOSPITAL_COMMUNITY): Payer: Self-pay
